# Patient Record
Sex: Male | Born: 1985 | Race: Black or African American | Hispanic: No | Marital: Single | Smoking: Never smoker
Health system: Southern US, Community
[De-identification: ages and names within clinical notes are randomized; demographics above are authoritative.]

## PROBLEM LIST (undated history)

## (undated) DIAGNOSIS — E119 Type 2 diabetes mellitus without complications: Secondary | ICD-10-CM

## (undated) DIAGNOSIS — I10 Essential (primary) hypertension: Secondary | ICD-10-CM

---
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
## 2004-03-02 ENCOUNTER — Ambulatory Visit: Payer: Self-pay | Admitting: Nurse Practitioner

## 2004-08-02 ENCOUNTER — Ambulatory Visit: Payer: Self-pay | Admitting: Nurse Practitioner

## 2004-11-21 ENCOUNTER — Ambulatory Visit: Payer: Self-pay | Admitting: Nurse Practitioner

## 2004-12-26 ENCOUNTER — Ambulatory Visit: Payer: Self-pay | Admitting: Nurse Practitioner

## 2004-12-26 ENCOUNTER — Ambulatory Visit (HOSPITAL_COMMUNITY): Admission: RE | Admit: 2004-12-26 | Discharge: 2004-12-26 | Payer: Self-pay | Admitting: Nurse Practitioner

## 2005-07-04 ENCOUNTER — Ambulatory Visit: Payer: Self-pay | Admitting: Nurse Practitioner

## 2005-07-05 ENCOUNTER — Ambulatory Visit: Payer: Self-pay | Admitting: *Deleted

## 2006-06-07 ENCOUNTER — Ambulatory Visit: Payer: Self-pay | Admitting: Nurse Practitioner

## 2006-07-04 ENCOUNTER — Ambulatory Visit: Payer: Self-pay | Admitting: Nurse Practitioner

## 2006-07-11 ENCOUNTER — Ambulatory Visit: Payer: Self-pay | Admitting: Nurse Practitioner

## 2007-01-01 ENCOUNTER — Encounter (INDEPENDENT_AMBULATORY_CARE_PROVIDER_SITE_OTHER): Payer: Self-pay | Admitting: *Deleted

## 2007-01-29 ENCOUNTER — Ambulatory Visit: Payer: Self-pay | Admitting: Family Medicine

## 2007-10-27 ENCOUNTER — Ambulatory Visit: Payer: Self-pay | Admitting: Family Medicine

## 2007-10-27 ENCOUNTER — Encounter (INDEPENDENT_AMBULATORY_CARE_PROVIDER_SITE_OTHER): Payer: Self-pay | Admitting: Family Medicine

## 2007-10-27 LAB — CONVERTED CEMR LAB
ALT: 59 units/L — ABNORMAL HIGH (ref 0–53)
Albumin: 4.2 g/dL (ref 3.5–5.2)
Alkaline Phosphatase: 51 units/L (ref 39–117)
Calcium: 8.9 mg/dL (ref 8.4–10.5)
Chloride: 98 meq/L (ref 96–112)
Glucose, Bld: 127 mg/dL — ABNORMAL HIGH (ref 70–99)
HDL: 32 mg/dL — ABNORMAL LOW (ref >39)
Microalb, Ur: 0.95 mg/dL (ref 0.00–1.89)
Sodium: 139 meq/L (ref 135–145)
Total Bilirubin: 0.3 mg/dL (ref 0.3–1.2)
Total CHOL/HDL Ratio: 4.2
Triglycerides: 340 mg/dL — ABNORMAL HIGH (ref <150)

## 2007-12-30 ENCOUNTER — Encounter (INDEPENDENT_AMBULATORY_CARE_PROVIDER_SITE_OTHER): Payer: Self-pay | Admitting: Family Medicine

## 2007-12-30 ENCOUNTER — Ambulatory Visit: Payer: Self-pay | Admitting: Internal Medicine

## 2007-12-30 LAB — CONVERTED CEMR LAB
Cholesterol: 168 mg/dL (ref 0–200)
HDL: 44 mg/dL (ref >39)

## 2008-01-16 ENCOUNTER — Ambulatory Visit: Payer: Self-pay | Admitting: Internal Medicine

## 2008-06-08 ENCOUNTER — Encounter (INDEPENDENT_AMBULATORY_CARE_PROVIDER_SITE_OTHER): Payer: Self-pay | Admitting: Internal Medicine

## 2008-06-08 ENCOUNTER — Ambulatory Visit: Payer: Self-pay | Admitting: Family Medicine

## 2008-06-30 ENCOUNTER — Ambulatory Visit: Payer: Self-pay | Admitting: Internal Medicine

## 2008-06-30 ENCOUNTER — Encounter (INDEPENDENT_AMBULATORY_CARE_PROVIDER_SITE_OTHER): Payer: Self-pay | Admitting: Internal Medicine

## 2008-06-30 LAB — CONVERTED CEMR LAB
ALT: 35 units/L (ref 0–53)
AST: 21 units/L (ref 0–37)
Albumin: 4.7 g/dL (ref 3.5–5.2)
Alkaline Phosphatase: 38 units/L — ABNORMAL LOW (ref 39–117)
Calcium: 9.7 mg/dL (ref 8.4–10.5)
Cholesterol: 172 mg/dL (ref 0–200)
Creatinine, Ser: 0.96 mg/dL (ref 0.40–1.50)
Glucose, Bld: 97 mg/dL (ref 70–99)
Potassium: 4.3 meq/L (ref 3.5–5.3)
Sodium: 137 meq/L (ref 135–145)
Total Bilirubin: 0.7 mg/dL (ref 0.3–1.2)
Total Protein: 7.7 g/dL (ref 6.0–8.3)

## 2009-07-04 ENCOUNTER — Ambulatory Visit: Payer: Self-pay | Admitting: Family Medicine

## 2012-01-25 ENCOUNTER — Encounter (HOSPITAL_COMMUNITY): Payer: Self-pay | Admitting: *Deleted

## 2012-01-25 ENCOUNTER — Encounter (HOSPITAL_COMMUNITY): Payer: Self-pay | Admitting: Nurse Practitioner

## 2012-01-25 ENCOUNTER — Emergency Department (INDEPENDENT_AMBULATORY_CARE_PROVIDER_SITE_OTHER)
Admission: EM | Admit: 2012-01-25 | Discharge: 2012-01-25 | Disposition: A | Discharge status: Nursing Home (Includes ICF) | Payer: Self-pay | Source: Home / Self Care | Attending: Family Medicine | Admitting: Family Medicine

## 2012-01-25 ENCOUNTER — Emergency Department (HOSPITAL_COMMUNITY)
Admission: EM | Admit: 2012-01-25 | Discharge: 2012-01-25 | Disposition: A | Discharge status: Home / Self Care | Payer: Self-pay | Attending: Emergency Medicine | Admitting: Emergency Medicine

## 2012-01-25 DIAGNOSIS — E119 Type 2 diabetes mellitus without complications: Secondary | ICD-10-CM | POA: Insufficient documentation

## 2012-01-25 DIAGNOSIS — L02211 Cutaneous abscess of abdominal wall: Secondary | ICD-10-CM

## 2012-01-25 DIAGNOSIS — I10 Essential (primary) hypertension: Secondary | ICD-10-CM | POA: Insufficient documentation

## 2012-01-25 DIAGNOSIS — Z760 Encounter for issue of repeat prescription: Secondary | ICD-10-CM

## 2012-01-25 DIAGNOSIS — L03319 Cellulitis of trunk, unspecified: Secondary | ICD-10-CM

## 2012-01-25 DIAGNOSIS — L02219 Cutaneous abscess of trunk, unspecified: Secondary | ICD-10-CM | POA: Insufficient documentation

## 2012-01-25 DIAGNOSIS — IMO0002 Reserved for concepts with insufficient information to code with codable children: Secondary | ICD-10-CM

## 2012-01-25 HISTORY — DX: Essential (primary) hypertension: I10

## 2012-01-25 HISTORY — DX: Type 2 diabetes mellitus without complications: E11.9

## 2012-01-25 LAB — POCT I-STAT, CHEM 8
Calcium, Ion: 1.1 mmol/L — ABNORMAL LOW (ref 1.12–1.23)
Chloride: 98 mEq/L (ref 96–112)
Glucose, Bld: 353 mg/dL — ABNORMAL HIGH (ref 70–99)
HCT: 51 % (ref 39.0–52.0)
Hemoglobin: 17.3 g/dL — ABNORMAL HIGH (ref 13.0–17.0)
Potassium: 3.8 mEq/L (ref 3.5–5.1)
Sodium: 136 mEq/L (ref 135–145)

## 2012-01-25 LAB — CBC WITH DIFFERENTIAL/PLATELET
Basophils Absolute: 0 10*3/uL (ref 0.0–0.1)
Hemoglobin: 16.6 g/dL (ref 13.0–17.0)
Lymphocytes Relative: 20 % (ref 12–46)
Monocytes Absolute: 0.9 10*3/uL (ref 0.1–1.0)
Monocytes Relative: 9 % (ref 3–12)
Neutro Abs: 7.5 10*3/uL (ref 1.7–7.7)
Platelets: 188 10*3/uL (ref 150–400)
RDW: 12.6 % (ref 11.5–15.5)
WBC: 10.6 10*3/uL — ABNORMAL HIGH (ref 4.0–10.5)

## 2012-01-25 MED ORDER — CLINDAMYCIN HCL 150 MG PO CAPS: 150.0000 mg | ORAL_CAPSULE | Freq: Four times a day (QID) | ORAL | Status: DC

## 2012-01-25 MED ORDER — HYDROCODONE-ACETAMINOPHEN 5-325 MG PO TABS: 1.0000 | ORAL_TABLET | ORAL | Status: DC | PRN

## 2012-01-25 MED ORDER — HYDROCHLOROTHIAZIDE 25 MG PO TABS: 25.0000 mg | ORAL_TABLET | Freq: Every day | ORAL | Status: DC

## 2012-01-25 MED ORDER — MORPHINE SULFATE 4 MG/ML IJ SOLN
4.0000 mg | Freq: Once | INTRAMUSCULAR | Status: AC
  Administered 2012-01-25: 4 mg via INTRAVENOUS
  Filled 2012-01-25: qty 1

## 2012-01-25 MED ORDER — METFORMIN HCL 850 MG PO TABS: 850.0000 mg | ORAL_TABLET | Freq: Two times a day (BID) | ORAL | Status: DC

## 2012-01-25 MED ORDER — LIDOCAINE-EPINEPHRINE 2 %-1:100000 IJ SOLN
20.0000 mL | Freq: Once | INTRAMUSCULAR | Status: AC
  Administered 2012-01-25: 20 mL via INTRADERMAL
  Filled 2012-01-25: qty 20

## 2012-01-25 MED ORDER — CLINDAMYCIN PHOSPHATE 600 MG/50ML IV SOLN
600.0000 mg | Freq: Once | INTRAVENOUS | Status: AC
  Administered 2012-01-25: 600 mg via INTRAVENOUS
  Filled 2012-01-25: qty 50

## 2012-01-25 NOTE — ED Provider Notes (Signed)
History     CSN: [id number]  Arrival date & time 01/25/12  1026   First MD Initiated Contact with Patient 01/25/12 1053      Chief Complaint  Patient presents with  . Recurrent Skin Infections    (Consider location/radiation/quality/duration/timing/severity/associated sxs/prior treatment) HPI  26 year old male with hx of diabetes presents for evaluation of skin infection.  Pt reports 1 week ago he noticed a bump near his waist line directly above his pubis.  Onset gradual, persistent, radiates to abdomen The bump has increased in size, with associated tenderness and surrounding redness.  Redness has increased with increasing pain.  Has tried neosporin and peroxide without relief.  Denies fever, headache,  n/v/d, dysuria, or testicular pain.  No prior hx of skin infection.  Denies tick exposure.  Has ran out of his metformin for 1 month. Does not have a PCP at this time. [facility]   Past Medical History  Diagnosis Date  . Diabetes mellitus without complication   . Hypertension     History reviewed. No pertinent past surgical history.  History reviewed. No pertinent family history.  History  Substance Use Topics  . Smoking status: Never Smoker   . Smokeless tobacco: Not on file  . Alcohol Use: No      Review of Systems  Constitutional: Negative for fever.  HENT: Negative for neck pain.   Respiratory: Negative for shortness of breath.   Cardiovascular: Negative for chest pain.  Skin: Positive for rash.  Neurological: Negative for numbness.  All other systems reviewed and are negative.    Allergies  Amoxicillin  Home Medications   Current Outpatient Rx  Name Route Sig Dispense Refill  . OMEGA-3 FATTY ACIDS 1000 MG PO CAPS Oral Take 1 g by mouth daily.    Marland Kitchen HYDROCHLOROTHIAZIDE 25 MG PO TABS Oral Take 25 mg by mouth daily.    Marland Kitchen METFORMIN HCL 850 MG PO TABS Oral Take 850 mg by mouth 2 (two) times daily with a meal.       BP 157/100  Pulse 95  Temp 99.1 F (37.3 C)  Resp 18  SpO2 99%  Physical Exam  Nursing note and vitals reviewed. Constitutional: He is oriented to person, place, and time. He appears well-developed and well-nourished. No distress.  HENT:  Head: Atraumatic.  Eyes: Conjunctivae normal are normal.  Abdominal: There is tenderness (cellulitic skin changes to lower panus (erythema, warmth) without induration or fluctuance.  No hernia).  Genitourinary: Penis normal.       Small area of induration and fluctuance noted above mon pubis.    Musculoskeletal: Normal range of motion. He exhibits no edema.  Neurological: He is alert and oriented to person, place, and time.  Skin: Skin is warm.    ED Course  Procedures (including critical care time)  Results for orders placed during the hospital encounter of 01/25/12  CBC WITH DIFFERENTIAL      Component Value Range   WBC 10.6 (*) 4.0 - 10.5 K/uL   RBC 5.80  4.22 - 5.81 MIL/uL   Hemoglobin 16.6  13.0 - 17.0 g/dL   HCT 40.9  81.1 - 91.4 %   MCV 80.0  78.0 - 100.0 fL   MCH 28.6  26.0 - 34.0 pg   MCHC 35.8  30.0 - 36.0 g/dL   RDW 78.2  95.6 - 21.3 %   Platelets 188  150 - 400 K/uL   Neutrophils Relative  70  43 - 77 %   Neutro Abs 7.5  1.7 - 7.7 K/uL   Lymphocytes Relative 20  12 - 46 %   Lymphs Abs 2.1  0.7 - 4.0 K/uL   Monocytes Relative 9  3 - 12 %   Monocytes Absolute 0.9  0.1 - 1.0 K/uL   Eosinophils Relative 1  0 - 5 %   Eosinophils Absolute 0.1  0.0 - 0.7 K/uL   Basophils Relative 0  0 - 1 %   Basophils Absolute 0.0  0.0 - 0.1 K/uL  POCT I-STAT, CHEM 8      Component Value Range   Sodium 136  135 - 145 mEq/L   Potassium 3.8  3.5 - 5.1 mEq/L   Chloride 98  96 - 112 mEq/L   BUN 8  6 - 23 mg/dL   Creatinine, Ser 1.61  0.50 - 1.35 mg/dL   Glucose, Bld 096 (*) 70 - 99 mg/dL   Calcium, Ion 0.45 (*) 1.12 - 1.23 mmol/L   TCO2 26  0 - 100 mmol/L   Hemoglobin 17.3 (*) 13.0 - 17.0 g/dL   HCT 40.9  81.1 - 91.4 %   No results  found.   INCISION AND DRAINAGE Performed by: Fayrene Helper Consent: Verbal consent obtained. Risks and benefits: risks, benefits and alternatives were discussed Type: abscess  Body area: anterior belt-line abd  Anesthesia: local infiltration  Local anesthetic: lidocaine 2% w epinephrine  Anesthetic total: 4 ml  Complexity: complex Blunt dissection to break up loculations  Drainage: purulent  Drainage amount: moderate  Packing material: none Patient tolerance: Patient tolerated the procedure well with no immediate complications.   1. Abscess cellulitis of abdominal wall  MDM  Pt has abscess cellulitis to lower abdominal wall. Has hx of diabetes.  Therefore, will perform I&D, and will treat cellulitis with IV clindamycine.  Pt otherwise afebrile, nontoxic, and in NAD.  Pain medication given.    1:02 PM I&D with moderate pustular discharge.  Due to location of wound, it was difficult to place packing.  Therefore, no packing placed.  Cellulitis margin were marked.  Pain medication given.  IV clindamycin given.     2:58 PM Pt felt better, finished abx.  Pt request for medication refill (metformin and HCTZ).  Will refill med, dc with clindamycin, pain medication.  Recommend return in 2 days for wound recheck.  Patient voiced understanding and agrees with plan  BP 133/73  Pulse 96  Temp 100.1 F (37.8 C) (Oral)  Resp 18  SpO2 100%  Nursing notes reviewed and considered in documentation  Previous records reviewed and considered  All labs/vitals reviewed and considered     Fayrene Helper, PA-C 01/25/12 1500

## 2012-01-25 NOTE — ED Notes (Signed)
Pt  Has  Redness  Tenderness  And  Swelling  To  The  abd

## 2012-01-25 NOTE — ED Provider Notes (Signed)
History     CSN: [id number]  Arrival date & time 01/25/12  7829   First MD Initiated Contact with Patient 01/25/12 [phone]      Chief Complaint  Patient presents with  . Abdominal Pain    (Consider location/radiation/quality/duration/timing/severity/associated sxs/prior treatment) Patient is a 26 y.o. male presenting with abdominal pain. The history is provided by the patient.  Abdominal Pain The primary symptoms of the illness include abdominal pain. The primary symptoms of the illness do not include fever, nausea, vomiting or diarrhea. The current episode started more than 2 days ago (1 week h/o pain , getting worse). The onset of the illness was gradual. The problem has been gradually worsening.  Risk factors for an acute abdominal problem include immunodeficiency. Additional symptoms associated with the illness include chills. Significant associated medical issues include diabetes.    Past Medical History  Diagnosis Date  . Diabetes mellitus without complication     History reviewed. No pertinent past surgical history.  No family history on file.  History  Substance Use Topics  . Smoking status: Never Smoker   . Smokeless tobacco: Not on file  . Alcohol Use: No      Review of Systems  Constitutional: Positive for chills. Negative for fever.  Gastrointestinal: Positive for abdominal pain. Negative for nausea, vomiting and diarrhea.  Skin: Positive for rash.    Allergies  Amoxapine and related and Amoxicillin  Home Medications   Current Outpatient Rx  Name Route Sig Dispense Refill  . METFORMIN HCL 850 MG PO TABS Oral Take 850 mg by mouth 2 (two) times daily with a meal.      BP 145/99  Pulse 86  Temp 99.9 F (37.7 C) (Oral)  Resp 20  SpO2 100%  Physical Exam  Nursing note and vitals reviewed. Constitutional: He is oriented to person, place, and time. He appears well-developed and well-nourished. He appears distressed.  Abdominal: He exhibits no  distension and no mass. There is tenderness. There is no rebound and no guarding.  Neurological: He is alert and oriented to person, place, and time.  Skin: Skin is warm and dry.       ED Course  Procedures (including critical care time)  Labs Reviewed - No data to display No results found.   1. Abdominal wall abscess       MDM         Linna Hoff, MD 01/25/12 1001

## 2012-01-25 NOTE — ED Notes (Signed)
Pt with reddened swollen tender area to abd, sent for further eval of cellulitis from [facility]

## 2012-01-25 NOTE — ED Notes (Signed)
Pt  Reports      r  Lower       Pain  With  Swelling   And    Tenderness  On  Palpation        He  Reports  The  Pain  For  About  1  Week  He  denys  Any injury     He  denys  Any  Vomiting     He  Is  A  Diabetic  Who  Is  Non  -  Compliant

## 2012-01-27 ENCOUNTER — Emergency Department (HOSPITAL_COMMUNITY)
Admission: EM | Admit: 2012-01-27 | Discharge: 2012-01-27 | Disposition: A | Discharge status: Home Health | Payer: Self-pay | Attending: Emergency Medicine | Admitting: Emergency Medicine

## 2012-01-27 ENCOUNTER — Encounter (HOSPITAL_COMMUNITY): Payer: Self-pay | Admitting: *Deleted

## 2012-01-27 DIAGNOSIS — Z09 Encounter for follow-up examination after completed treatment for conditions other than malignant neoplasm: Secondary | ICD-10-CM | POA: Insufficient documentation

## 2012-01-27 DIAGNOSIS — Z5189 Encounter for other specified aftercare: Secondary | ICD-10-CM

## 2012-01-27 DIAGNOSIS — I10 Essential (primary) hypertension: Secondary | ICD-10-CM | POA: Insufficient documentation

## 2012-01-27 DIAGNOSIS — E119 Type 2 diabetes mellitus without complications: Secondary | ICD-10-CM | POA: Insufficient documentation

## 2012-01-27 DIAGNOSIS — L02219 Cutaneous abscess of trunk, unspecified: Secondary | ICD-10-CM | POA: Insufficient documentation

## 2012-01-27 DIAGNOSIS — Z881 Allergy status to other antibiotic agents status: Secondary | ICD-10-CM | POA: Insufficient documentation

## 2012-01-27 NOTE — ED Notes (Signed)
Pt presents to department for evaluation of abscess follow up. Pt states he had I&D performed to R lower abdominal abscess x2 days ago. Pt states swelling and pain has decreased. No drainage/redness noted at the time. Denies pain. He is alert and oriented x4. No signs of distress noted at the time.

## 2012-01-27 NOTE — ED Provider Notes (Signed)
Medical screening examination/treatment/procedure(s) were performed by non-physician practitioner and as supervising physician I was immediately available for consultation/collaboration.  Toy Baker, MD 01/27/12 [phone]

## 2012-01-27 NOTE — ED Notes (Signed)
Pt is here to have right lower abdominal abscess site rechecked after being drained 2 days ago.  Denies worsening symptoms

## 2012-01-27 NOTE — ED Provider Notes (Signed)
History   .This chart was scribed for Derwood Kaplan, MD by Charolett Bumpers . The patient was seen in room TR08C/TR08C. Patient's care was started at 12:15.  CSN: [id number] Arrival date & time 01/27/12  1156  None    Chief Complaint  Patient presents with  . Follow-up   HPI Comments: William Sanders is a 26 y.o. male who presents to the Emergency Department for a re-check after having an abscess located in her perineum are drained 2 days ago. Pt states the abscess was not packed. He reports an associated rash that has since improved and is within the skin marking from 2 days ago. No current pain. He states his symptoms have improved. He denies any n/v, fevers, chills. Currently on antibiotics. He has a h/o diabetes, He denies any other complaints at this time.   Patient is a 26 y.o. male presenting with wound check. The history is provided by the patient. No language interpreter was used.  Wound Check  He was treated in the ED 2 to 3 days ago. Previous treatment in the ED includes I&D of abscess. Treatments since wound repair include oral antibiotics. There has been no drainage from the wound. The redness has improved. The swelling has improved. The pain has improved.    Past Medical History  Diagnosis Date  . Diabetes mellitus without complication   . Hypertension     History reviewed. No pertinent past surgical history.  No family history on file.  History  Substance Use Topics  . Smoking status: Never Smoker   . Smokeless tobacco: Not on file  . Alcohol Use: No      Review of Systems  Constitutional: Negative for fever and chills.  Respiratory: Negative for cough and shortness of breath.   Gastrointestinal: Negative for nausea and vomiting.  Skin: Positive for wound.  Neurological: Negative for weakness and headaches.  All other systems reviewed and are negative.    Allergies  Amoxicillin  Home Medications   Current Outpatient Rx  Name Route Sig Dispense  Refill  . CLINDAMYCIN HCL 150 MG PO CAPS Oral Take 1 capsule (150 mg total) by mouth every 6 (six) hours. 28 capsule 0  . OMEGA-3 FATTY ACIDS 1000 MG PO CAPS Oral Take 1 g by mouth daily.    Marland Kitchen HYDROCHLOROTHIAZIDE 25 MG PO TABS Oral Take 1 tablet (25 mg total) by mouth daily. 30 tablet 3  . HYDROCODONE-ACETAMINOPHEN 5-325 MG PO TABS Oral Take 1 tablet by mouth every 4 (four) hours as needed for pain. 10 tablet 0  . METFORMIN HCL 850 MG PO TABS Oral Take 1 tablet (850 mg total) by mouth 2 (two) times daily with a meal. 60 tablet 3    BP 129/80  Pulse 91  Temp 98.4 F (36.9 C) (Oral)  Resp 18  SpO2 97%  Physical Exam  Nursing note and vitals reviewed. Constitutional: He is oriented to person, place, and time. He appears well-developed and well-nourished. No distress.  HENT:  Head: Normocephalic and atraumatic.  Eyes: EOM are normal.  Neck: Neck supple. No tracheal deviation present.  Cardiovascular: Normal rate.   Pulmonary/Chest: Effort normal. No respiratory distress.  Musculoskeletal: Normal range of motion.  Neurological: He is alert and oriented to person, place, and time.  Skin: Skin is warm and dry. Rash noted.       In perineum a puncture wound with induration surrounding. No fluctuance or active drainage. Rash is now better and within the skin markings made previously.  Psychiatric: He has a normal mood and affect. His behavior is normal.    ED Course  Procedures (including critical care time)  DIAGNOSTIC STUDIES: Oxygen Saturation is 97% on room air, normal by my interpretation.    COORDINATION OF CARE:  12:18-Discussed planned course of treatment with the patient including continuing with antibiotics and keeping area sterile, who is agreeable at this time.     Labs Reviewed - No data to display No results found.   No diagnosis found.    MDM  Medical screening examination/treatment/procedure(s) were performed by me as the supervising physician. Scribe  service was utilized for documentation only.  Pt comes in for wound check. The rash is clearly way within the demarcated boundary from 2 days ago, the i&d side looks clean, no discharge, no abd pain, no crepitus. Will be discharged with continued expectant care.    Derwood Kaplan, MD 01/27/12 1244

## 2012-01-27 NOTE — ED Notes (Signed)
Pt discharged to home with family. NAD. Pt voiced understanding of discharge instructions.

## 2013-01-28 ENCOUNTER — Emergency Department (HOSPITAL_COMMUNITY)
Admission: EM | Admit: 2013-01-28 | Discharge: 2013-01-28 | Disposition: A | Discharge status: Home / Self Care | Payer: BC Managed Care – PPO | Attending: Emergency Medicine | Admitting: Emergency Medicine

## 2013-01-28 ENCOUNTER — Encounter (HOSPITAL_COMMUNITY): Payer: Self-pay | Admitting: Emergency Medicine

## 2013-01-28 DIAGNOSIS — Z79899 Other long term (current) drug therapy: Secondary | ICD-10-CM | POA: Insufficient documentation

## 2013-01-28 DIAGNOSIS — K0889 Other specified disorders of teeth and supporting structures: Secondary | ICD-10-CM

## 2013-01-28 DIAGNOSIS — E119 Type 2 diabetes mellitus without complications: Secondary | ICD-10-CM | POA: Insufficient documentation

## 2013-01-28 DIAGNOSIS — K044 Acute apical periodontitis of pulpal origin: Secondary | ICD-10-CM | POA: Insufficient documentation

## 2013-01-28 DIAGNOSIS — I10 Essential (primary) hypertension: Secondary | ICD-10-CM | POA: Insufficient documentation

## 2013-01-28 DIAGNOSIS — K047 Periapical abscess without sinus: Secondary | ICD-10-CM

## 2013-01-28 MED ORDER — HYDROCODONE-ACETAMINOPHEN 5-325 MG PO TABS
1.0000 | ORAL_TABLET | ORAL | Status: DC | PRN
Start: 2013-01-28 — End: 2013-10-26

## 2013-01-28 MED ORDER — CLINDAMYCIN HCL 300 MG PO CAPS: 300.0000 mg | ORAL_CAPSULE | Freq: Four times a day (QID) | ORAL | Status: DC

## 2013-01-28 NOTE — ED Provider Notes (Signed)
CSN: [id number]     Arrival date & time 01/28/13  0747 History   First MD Initiated Contact with Patient 01/28/13 0750     Chief Complaint  Patient presents with  . Dental Pain   (Consider location/radiation/quality/duration/timing/severity/associated sxs/prior Treatment) HPI Comments: 27 y/o male presents to the ED complaining of left lower dental pain intermittently x 2 weeks. Pain described as dull, sharp with chewing rated 4-5/10. Denies facial swelling, fever, chills, difficulty swallowing. Tried taking ibuprofen with minimal relief. States his wisdom teeth have been bothering him recently and would like a referral to a dentist to have them removed.   Patient is a 27 y.o. male presenting with tooth pain. The history is provided by the patient.  Dental Pain   Past Medical History  Diagnosis Date  . Diabetes mellitus without complication   . Hypertension    History reviewed. No pertinent past surgical history. No family history on file. History  Substance Use Topics  . Smoking status: Never Smoker   . Smokeless tobacco: Not on file  . Alcohol Use: No    Review of Systems  HENT: Positive for dental problem.   All other systems reviewed and are negative.    Allergies  Amoxicillin  Home Medications   Current Outpatient Rx  Name  Route  Sig  Dispense  Refill  . clindamycin (CLEOCIN) 150 MG capsule   Oral   Take 150 mg by mouth every 6 (six) hours. FOR 7 DAYS; Started on 01/25/12.         . clindamycin (CLEOCIN) 300 MG capsule   Oral   Take 1 capsule (300 mg total) by mouth 4 (four) times daily. X 7 days   28 capsule   0   . hydrochlorothiazide (HYDRODIURIL) 25 MG tablet   Oral   Take 1 tablet (25 mg total) by mouth daily.   30 tablet   3   . HYDROcodone-acetaminophen (NORCO/VICODIN) 5-325 MG per tablet   Oral   Take 1 tablet by mouth every 4 (four) hours as needed for pain.   10 tablet   0   . HYDROcodone-acetaminophen (NORCO/VICODIN) 5-325 MG per  tablet   Oral   Take 1-2 tablets by mouth every 4 (four) hours as needed for pain.   8 tablet   0   . metFORMIN (GLUCOPHAGE) 850 MG tablet   Oral   Take 1 tablet (850 mg total) by mouth 2 (two) times daily with a meal.   60 tablet   3    BP 153/93  Pulse 91  Temp(Src) 98.4 F (36.9 C) (Oral)  Resp 18  Ht 5\' 10"  (1.778 m)  Wt 288 lb (130.636 kg)  BMI 41.32 kg/m2  SpO2 98% Physical Exam  Constitutional: He is oriented to person, place, and time. He appears well-developed and well-nourished. No distress.  HENT:  Head: Normocephalic and atraumatic.  Mouth/Throat: Uvula is midline, oropharynx is clear and moist and mucous membranes are normal.    Left lower third molar TTP with surrounding erythema and edema. No abscess.  Eyes: Conjunctivae are normal.  Neck: Normal range of motion. Neck supple.  Cardiovascular: Normal rate, regular rhythm and normal heart sounds.   Pulmonary/Chest: Effort normal and breath sounds normal.  Musculoskeletal: Normal range of motion. He exhibits no edema.  Lymphadenopathy:       Head (left side): Submandibular adenopathy present.  Neurological: He is alert and oriented to person, place, and time.  Skin: Skin is warm and dry. He  is not diaphoretic.  Psychiatric: He has a normal mood and affect. His behavior is normal.    ED Course  Procedures (including critical care time) Labs Review Labs Reviewed - No data to display Imaging Review No results found.  EKG Interpretation   None       MDM   1. Dental infection   2. Pain, dental     Dental pain associated with dental infection. No evidence of dental abscess. Patient is afebrile, non toxic appearing and swallowing secretions well. I gave patient referral to dentist and stressed the importance of dental follow up for ultimate management of dental pain. I will also give amoxicillin and pain control. Patient voices understanding and is agreeable to plan.   Trevor Mace, PA-C 01/28/13  [phone]

## 2013-01-28 NOTE — ED Notes (Signed)
Pt c/o left sided dental pain X 2 weeks. sts he doesn't have a dentist and would also like a referral to one. Pt in nad, skin warm and dry, resp e/u.

## 2013-01-28 NOTE — ED Notes (Signed)
Pharmacy tech at bedside 

## 2013-01-31 NOTE — ED Provider Notes (Signed)
Medical screening examination/treatment/procedure(s) were performed by non-physician practitioner and as supervising physician I was immediately available for consultation/collaboration.  Donnetta Hutching, MD 01/31/13 [phone]

## 2013-10-26 ENCOUNTER — Emergency Department (HOSPITAL_COMMUNITY): Payer: BC Managed Care – PPO

## 2013-10-26 ENCOUNTER — Inpatient Hospital Stay (HOSPITAL_COMMUNITY)
Admission: EM | Admit: 2013-10-26 | Discharge: 2013-10-29 | DRG: 872 | Disposition: A | Discharge status: Home / Self Care | Payer: BC Managed Care – PPO | Attending: Internal Medicine | Admitting: Internal Medicine

## 2013-10-26 ENCOUNTER — Encounter (HOSPITAL_COMMUNITY): Payer: Self-pay | Admitting: Emergency Medicine

## 2013-10-26 DIAGNOSIS — Z91199 Patient's noncompliance with other medical treatment and regimen due to unspecified reason: Secondary | ICD-10-CM

## 2013-10-26 DIAGNOSIS — E118 Type 2 diabetes mellitus with unspecified complications: Secondary | ICD-10-CM

## 2013-10-26 DIAGNOSIS — E11628 Type 2 diabetes mellitus with other skin complications: Secondary | ICD-10-CM

## 2013-10-26 DIAGNOSIS — Z598 Other problems related to housing and economic circumstances: Secondary | ICD-10-CM

## 2013-10-26 DIAGNOSIS — R739 Hyperglycemia, unspecified: Secondary | ICD-10-CM

## 2013-10-26 DIAGNOSIS — IMO0001 Reserved for inherently not codable concepts without codable children: Secondary | ICD-10-CM | POA: Diagnosis present

## 2013-10-26 DIAGNOSIS — L03319 Cellulitis of trunk, unspecified: Secondary | ICD-10-CM

## 2013-10-26 DIAGNOSIS — Z881 Allergy status to other antibiotic agents status: Secondary | ICD-10-CM

## 2013-10-26 DIAGNOSIS — E1165 Type 2 diabetes mellitus with hyperglycemia: Secondary | ICD-10-CM

## 2013-10-26 DIAGNOSIS — IMO0002 Reserved for concepts with insufficient information to code with codable children: Secondary | ICD-10-CM | POA: Diagnosis present

## 2013-10-26 DIAGNOSIS — L0390 Cellulitis, unspecified: Secondary | ICD-10-CM

## 2013-10-26 DIAGNOSIS — L02211 Cutaneous abscess of abdominal wall: Secondary | ICD-10-CM

## 2013-10-26 DIAGNOSIS — L02219 Cutaneous abscess of trunk, unspecified: Secondary | ICD-10-CM | POA: Diagnosis present

## 2013-10-26 DIAGNOSIS — Z5987 Material hardship due to limited financial resources, not elsewhere classified: Secondary | ICD-10-CM

## 2013-10-26 DIAGNOSIS — I10 Essential (primary) hypertension: Secondary | ICD-10-CM | POA: Diagnosis present

## 2013-10-26 DIAGNOSIS — Z8249 Family history of ischemic heart disease and other diseases of the circulatory system: Secondary | ICD-10-CM

## 2013-10-26 DIAGNOSIS — L03311 Cellulitis of abdominal wall: Secondary | ICD-10-CM | POA: Diagnosis present

## 2013-10-26 DIAGNOSIS — Z9119 Patient's noncompliance with other medical treatment and regimen: Secondary | ICD-10-CM

## 2013-10-26 DIAGNOSIS — A419 Sepsis, unspecified organism: Principal | ICD-10-CM | POA: Diagnosis present

## 2013-10-26 LAB — COMPREHENSIVE METABOLIC PANEL
ALBUMIN: 4 g/dL (ref 3.5–5.2)
ALT: 123 U/L — AB (ref 0–53)
ANION GAP: 19 — AB (ref 5–15)
AST: 46 U/L — ABNORMAL HIGH (ref 0–37)
Alkaline Phosphatase: 68 U/L (ref 39–117)
BILIRUBIN TOTAL: 0.9 mg/dL (ref 0.3–1.2)
BUN: 7 mg/dL (ref 6–23)
CALCIUM: 9.1 mg/dL (ref 8.4–10.5)
CO2: 21 mEq/L (ref 19–32)
Chloride: 94 mEq/L — ABNORMAL LOW (ref 96–112)
Creatinine, Ser: 0.74 mg/dL (ref 0.50–1.35)
GFR calc non Af Amer: >90 mL/min (ref >90)
Glucose, Bld: 403 mg/dL — ABNORMAL HIGH (ref 70–99)
Potassium: 3.8 mEq/L (ref 3.7–5.3)
Sodium: 134 mEq/L — ABNORMAL LOW (ref 137–147)
TOTAL PROTEIN: 8.1 g/dL (ref 6.0–8.3)

## 2013-10-26 LAB — CBC WITH DIFFERENTIAL/PLATELET
Basophils Absolute: 0 10*3/uL (ref 0.0–0.1)
Basophils Relative: 0 % (ref 0–1)
EOS ABS: 0 10*3/uL (ref 0.0–0.7)
EOS PCT: 0 % (ref 0–5)
HEMATOCRIT: 47.5 % (ref 39.0–52.0)
Hemoglobin: 16.9 g/dL (ref 13.0–17.0)
LYMPHS ABS: 2.7 10*3/uL (ref 0.7–4.0)
LYMPHS PCT: 30 % (ref 12–46)
MCH: 28.7 pg (ref 26.0–34.0)
MCHC: 35.6 g/dL (ref 30.0–36.0)
MCV: 80.6 fL (ref 78.0–100.0)
MONOS PCT: 11 % (ref 3–12)
Monocytes Absolute: 1 10*3/uL (ref 0.1–1.0)
NEUTROS ABS: 5.3 10*3/uL (ref 1.7–7.7)
NEUTROS PCT: 59 % (ref 43–77)
PLATELETS: 183 10*3/uL (ref 150–400)
RBC: 5.89 MIL/uL — AB (ref 4.22–5.81)
RDW: 12.9 % (ref 11.5–15.5)
WBC: 9 10*3/uL (ref 4.0–10.5)

## 2013-10-26 LAB — I-STAT CG4 LACTIC ACID, ED: LACTIC ACID, VENOUS: 2.14 mmol/L (ref 0.5–2.2)

## 2013-10-26 LAB — CBG MONITORING, ED: GLUCOSE-CAPILLARY: 255 mg/dL — AB (ref 70–99)

## 2013-10-26 MED ORDER — ACETAMINOPHEN 325 MG PO TABS
650.0000 mg | ORAL_TABLET | Freq: Four times a day (QID) | ORAL | Status: DC | PRN
  Administered 2013-10-26: 650 mg via ORAL
  Filled 2013-10-26: qty 2

## 2013-10-26 MED ORDER — INSULIN ASPART 100 UNIT/ML ~~LOC~~ SOLN
5.0000 [IU] | Freq: Once | SUBCUTANEOUS | Status: AC
  Administered 2013-10-26: 5 [IU] via INTRAVENOUS
  Filled 2013-10-26: qty 1

## 2013-10-26 MED ORDER — ACETAMINOPHEN 325 MG PO TABS
650.0000 mg | ORAL_TABLET | Freq: Once | ORAL | Status: DC
  Filled 2013-10-26: qty 2

## 2013-10-26 MED ORDER — CLINDAMYCIN PHOSPHATE 600 MG/50ML IV SOLN
600.0000 mg | Freq: Once | INTRAVENOUS | Status: AC
  Administered 2013-10-26: 600 mg via INTRAVENOUS
  Filled 2013-10-26: qty 50

## 2013-10-26 MED ORDER — IOHEXOL 300 MG/ML  SOLN
100.0000 mL | Freq: Once | INTRAMUSCULAR | Status: AC | PRN
  Administered 2013-10-26: 100 mL via INTRAVENOUS

## 2013-10-26 MED ORDER — SODIUM CHLORIDE 0.9 % IV BOLUS (SEPSIS): 1000.0000 mL | Freq: Once | INTRAVENOUS | Status: DC

## 2013-10-26 MED ORDER — SODIUM CHLORIDE 0.9 % IV SOLN: INTRAVENOUS | Status: AC

## 2013-10-26 NOTE — Discharge Instructions (Signed)
Metformin and X-ray Contrast Studies °For some X-ray exams, a contrast dye is used. Contrast dye is a type of medicine used to make the X-ray image clearer. The contrast dye is given to the patient through a vein (intravenously). If you need to have this type of X-ray exam and you take a medication called metformin, your caregiver may have you stop taking metformin before the exam.  °LACTIC ACIDOSIS °In rare cases, a serious medical condition called lactic acidosis can develop in people who take metformin and receive contrast dye. The following conditions can increase the risk of this complication:  °· Kidney failure. °· Liver problems. °· Certain types of heart problems such as: °¨ Heart failure. °¨ Heart attack. °¨ Heart infection. °¨ Heart valve problems. °· Alcohol abuse. °If left untreated, lactic acidosis can lead to coma.  °SYMPTOMS OF LACTIC ACIDOSIS °Symptoms of lactic acidosis can include: °· Rapid breathing (hyperventilation). °· Neurologic symptoms such as: °¨ Headaches. °¨ Confusion. °¨ Dizziness. °· Excessive sweating. °· Feeling sick to your stomach (nauseous) or throwing up (vomiting). °AFTER THE X-RAY EXAM °· Stay well-hydrated. Drink fluids as instructed by your caregiver. °· If you have a risk of developing lactic acidosis, blood tests may be done to make sure your kidney function is okay. °· Metformin is usually stopped for 48 hours after the X-ray exam. Ask your caregiver when you can start taking metformin again. °SEEK MEDICAL CARE IF:  °· You have shortness of breath or difficulty breathing. °· You develop a headache that does not go away. °· You have nausea or vomiting. °· You urinate more than normal. °· You develop a skin rash and have: °¨ Redness. °¨ Swelling. °¨ Itching. °Document Released: 03/21/2009 Document Revised: 06/25/2011 Document Reviewed: 03/21/2009 °[employer]® Patient Information ©2015 [employer]. This information is not intended to replace advice given to you by your health  care provider. Make sure you discuss any questions you have with your health care provider. ° °

## 2013-10-26 NOTE — Progress Notes (Signed)
Received pt report from Tiffany,RN-ED.

## 2013-10-26 NOTE — ED Notes (Signed)
Pt here with c/o lower abd swelling, redness, warmth, and burning sensation. Pt reports he noticed sx on Saturday and they progressively got worse. Pt has hx of cyst on abd, pt had cyst drained the last time he was here. Pt rates abd pain 0/10, states he only has pain when he moves.

## 2013-10-26 NOTE — ED Notes (Signed)
Dr Zavitz at bedside  

## 2013-10-26 NOTE — ED Provider Notes (Signed)
CSN: [id number]     Arrival date & time 10/26/13  2014 History   First MD Initiated Contact with Patient 10/26/13 2041     Chief Complaint  Patient presents with  . Abdominal Pain     (Consider location/radiation/quality/duration/timing/severity/associated sxs/prior Treatment) HPI Comments: 28 year old male with history of diabetes on oral medications, high blood pressure, abscess, nonsmoker, diabetes normally controlled presents with worsening erythema lower abdomen and cyst/abscess. Patient has had draining in the past and is not currently in antibiotics. Symptoms worsening the past 2 days and fever just noticed this evening. Patient feels overall well otherwise without vomiting or new other symptoms.  Patient is a 28 y.o. male presenting with abdominal pain. The history is provided by the patient.  Abdominal Pain Associated symptoms: no chest pain, no chills, no dysuria, no fever, no shortness of breath and no vomiting     Past Medical History  Diagnosis Date  . Diabetes mellitus without complication   . Hypertension    History reviewed. No pertinent past surgical history. No family history on file. History  Substance Use Topics  . Smoking status: Never Smoker   . Smokeless tobacco: Not on file  . Alcohol Use: No    Review of Systems  Constitutional: Positive for appetite change. Negative for fever and chills.  HENT: Negative for congestion.   Eyes: Negative for visual disturbance.  Respiratory: Negative for shortness of breath.   Cardiovascular: Negative for chest pain.  Gastrointestinal: Positive for abdominal pain. Negative for vomiting.  Genitourinary: Negative for dysuria and flank pain.  Musculoskeletal: Negative for back pain, neck pain and neck stiffness.  Skin: Positive for color change and rash.  Neurological: Negative for light-headedness and headaches.      Allergies  Amoxicillin  Home Medications   Prior to Admission medications   Medication Sig  Start Date End Date Taking? Authorizing Provider  hydrochlorothiazide (HYDRODIURIL) 25 MG tablet Take 25 mg by mouth daily.   Yes Historical Provider, MD  metFORMIN (GLUCOPHAGE) 850 MG tablet Take 850 mg by mouth 2 (two) times daily with a meal.   Yes Historical Provider, MD   BP 145/89  Pulse 115  Temp(Src) 101.6 F (38.7 C) (Oral)  Resp 16  Ht 5\' 9"  (1.753 m)  Wt 288 lb (130.636 kg)  BMI 42.51 kg/m2  SpO2 97% Physical Exam  Nursing note and vitals reviewed. Constitutional: He is oriented to person, place, and time. He appears well-developed and well-nourished.  HENT:  Head: Normocephalic and atraumatic.  Eyes: Conjunctivae are normal. Right eye exhibits no discharge. Left eye exhibits no discharge.  Neck: Normal range of motion. Neck supple. No tracheal deviation present.  Cardiovascular: Regular rhythm.  Tachycardia present.   Pulmonary/Chest: Effort normal and breath sounds normal.  Abdominal: Soft. He exhibits no distension. There is tenderness (superficial tenderness to cellulitic region lower central abdomen with mild induration left aspect, no crepitus). There is no guarding.  Musculoskeletal: He exhibits no edema.  Neurological: He is alert and oriented to person, place, and time.  Skin: Skin is warm. Rash noted.     Cellulitis lower abdomen with mild induration no crepitus, no involvement of testicles or groin.  Psychiatric: He has a normal mood and affect.    ED Course  Procedures (including critical care time) Labs Review Labs Reviewed  CBC WITH DIFFERENTIAL - Abnormal; Notable for the following:    RBC 5.89 (*)    All other components within normal limits  COMPREHENSIVE METABOLIC PANEL - Abnormal; Notable  for the following:    Sodium 134 (*)    Chloride 94 (*)    Glucose, Bld 403 (*)    AST 46 (*)    ALT 123 (*)    Anion gap 19 (*)    All other components within normal limits  CBG MONITORING, ED - Abnormal; Notable for the following:    Glucose-Capillary  255 (*)    All other components within normal limits  CULTURE, BLOOD (ROUTINE X 2)  CULTURE, BLOOD (ROUTINE X 2)  I-STAT CG4 LACTIC ACID, ED    Imaging Review Ct Abdomen Pelvis W Contrast  10/26/2013   CLINICAL DATA:  Abdominal wall cellulitis  EXAM: CT ABDOMEN AND PELVIS WITH CONTRAST  TECHNIQUE: Multidetector CT imaging of the abdomen and pelvis was performed using the standard protocol following bolus administration of intravenous contrast.  CONTRAST:  OMNIPAQUE IOHEXOL 300 MG/ML  SOLN  COMPARISON:  None.  FINDINGS: Lung bases are clear.  Liver is enlarged measuring 20.6 cm in length. There is diffuse fatty change in the liver. No focal liver lesions are appreciated. There is no appreciable biliary duct dilatation. Gallbladder wall is not thickened.  Spleen, pancreas, and adrenals appear normal. Kidneys bilaterally show no mass or hydronephrosis on either side. There is no renal or ureteral calculus on either side. There is a retroaortic left renal vein, an anatomic variant.  There is thickening of the subcutaneous tissue in the anterior mid to lower abdominal and pelvic wall regions. There is a fluid collection in the anterior left pelvic wall with surrounding inflammation measuring 4.9 x 3.2 cm. On the right in this same area, there is a smaller thick-walled fluid collection measuring 1.9 x 1.9 cm in the subcutaneous region. Both of these lesions are best seen on axial slice 74 series 201.  In the pelvis, the urinary bladder is midline with normal wall thickness. There is no pelvic mass or fluid collection. There are small inguinal lymph nodes which do not meet size criteria for pathologic significance. The appendix appears normal.  There is no bowel obstruction.  No free air or portal venous air.  There is no ascites or adenopathy in the abdomen or pelvis. There is no intraperitoneal or retroperitoneal abscess. There is no evidence of abdominal aortic aneurysm. There are no blastic or lytic bone  lesions.  IMPRESSION: Anterior lower abdominal and pelvic wall cellulitis. At the level of the mid to lower pelvis, there is a phlegmon in the anterior left pelvic wall measuring 4.9 x 3.2 cm. At this same level on the right, there is a 1.9 x 1.9 cm phlegmon in the subcutaneous region of the anterior pelvic wall as well.  Liver is enlarged with fatty change.  Appendix appears normal. No bowel obstruction. No intraperitoneal or retroperitoneal abscess. No renal or ureteral calculus. No hydronephrosis.   Electronically Signed   By: Bretta Bang M.D.   On: 10/26/2013 22:19     EKG Interpretation None      MDM   Final diagnoses:  Abdominal wall cellulitis  Abdominal wall abscess  DM Hyperglycemia  Clinically controlled diabetic with cellulitis and likely abscess. With significant synovitis on exam and lower abdominal region plan for blood work, clindamycin IV, Tylenol and imaging to delineate extension of abscess/cellulitis. Fluid bolus given. Insulin given. And  Patient with significant abdominal wall cellulitis with early abscess on CT scan. Discussed general surgery who recommends IV antibiotics by medicine and we'll monitor. No I&D at this time. Updated patient on  plan.  The patients results and plan were reviewed and discussed.   Any x-rays performed were personally reviewed by myself.   Differential diagnosis were considered with the presenting HPI.  Medications  acetaminophen (TYLENOL) tablet 650 mg (650 mg Oral Given 10/26/13 2038)  sodium chloride 0.9 % bolus 1,000 mL (1,000 mLs Intravenous Transfusing/Transfer 10/26/13 2221)  0.9 %  sodium chloride infusion (not administered)  clindamycin (CLEOCIN) IVPB 600 mg (0 mg Intravenous Stopped 10/26/13 2256)  iohexol (OMNIPAQUE) 300 MG/ML solution 100 mL (100 mLs Intravenous Contrast Given 10/26/13 2154)  insulin aspart (novoLOG) injection 5 Units (5 Units Intravenous Given 10/26/13 2229)   he is an in and  Abdominal wall cellulitis,  sepsis, abdominal wall phlegmon/abscess, hyperglycemia, diabetes  Filed Vitals:   10/26/13 2223 10/26/13 2230 10/26/13 2300 10/26/13 2330  BP:  128/72 142/81 138/76  Pulse:  107 105 103  Temp: 99.3 F (37.4 C)     TempSrc: Oral     Resp:  15 24 20   Height:      Weight:      SpO2:  98% 94% 97%    Admission/ observation were discussed with the admitting physician, patient and/or family and they are comfortable with the plan.     Enid Skeens, MD 10/27/13 0001

## 2013-10-26 NOTE — ED Notes (Signed)
I Stat Lactic Acid results shown to Dr. J. Zavitz 

## 2013-10-26 NOTE — ED Notes (Signed)
Pt states he has been having some lower abd swelling that is similar to when he has a cyst that needed to be drained in the past.  Pt has a large area of swelling to his lower abd that is red

## 2013-10-27 ENCOUNTER — Encounter (HOSPITAL_COMMUNITY)
Admission: EM | Disposition: A | Discharge status: Home / Self Care | Payer: Self-pay | Source: Home / Self Care | Attending: Internal Medicine

## 2013-10-27 ENCOUNTER — Encounter (HOSPITAL_COMMUNITY): Payer: BC Managed Care – PPO | Admitting: Certified Registered"

## 2013-10-27 ENCOUNTER — Inpatient Hospital Stay (HOSPITAL_COMMUNITY): Payer: BC Managed Care – PPO | Admitting: Certified Registered"

## 2013-10-27 ENCOUNTER — Encounter (HOSPITAL_COMMUNITY): Payer: Self-pay | Admitting: Internal Medicine

## 2013-10-27 DIAGNOSIS — L03319 Cellulitis of trunk, unspecified: Secondary | ICD-10-CM

## 2013-10-27 DIAGNOSIS — R7309 Other abnormal glucose: Secondary | ICD-10-CM

## 2013-10-27 DIAGNOSIS — IMO0002 Reserved for concepts with insufficient information to code with codable children: Secondary | ICD-10-CM | POA: Diagnosis present

## 2013-10-27 DIAGNOSIS — L02219 Cutaneous abscess of trunk, unspecified: Secondary | ICD-10-CM

## 2013-10-27 DIAGNOSIS — E1165 Type 2 diabetes mellitus with hyperglycemia: Secondary | ICD-10-CM | POA: Diagnosis present

## 2013-10-27 DIAGNOSIS — L0291 Cutaneous abscess, unspecified: Secondary | ICD-10-CM

## 2013-10-27 DIAGNOSIS — A419 Sepsis, unspecified organism: Secondary | ICD-10-CM

## 2013-10-27 DIAGNOSIS — L0390 Cellulitis, unspecified: Secondary | ICD-10-CM

## 2013-10-27 DIAGNOSIS — I10 Essential (primary) hypertension: Secondary | ICD-10-CM

## 2013-10-27 DIAGNOSIS — E118 Type 2 diabetes mellitus with unspecified complications: Secondary | ICD-10-CM

## 2013-10-27 DIAGNOSIS — E1169 Type 2 diabetes mellitus with other specified complication: Secondary | ICD-10-CM

## 2013-10-27 DIAGNOSIS — E119 Type 2 diabetes mellitus without complications: Secondary | ICD-10-CM

## 2013-10-27 DIAGNOSIS — L988 Other specified disorders of the skin and subcutaneous tissue: Secondary | ICD-10-CM

## 2013-10-27 HISTORY — PX: DEBRIDEMENT OF ABDOMINAL WALL ABSCESS: SHX6396

## 2013-10-27 LAB — CK TOTAL AND CKMB (NOT AT ARMC)
CK, MB: 1 ng/mL (ref 0.3–4.0)
RELATIVE INDEX: 0.7 (ref 0.0–2.5)
Total CK: 141 U/L (ref 7–232)

## 2013-10-27 LAB — CBC
HEMATOCRIT: 45.3 % (ref 39.0–52.0)
Hemoglobin: 15.7 g/dL (ref 13.0–17.0)
MCH: 28.3 pg (ref 26.0–34.0)
MCHC: 34.7 g/dL (ref 30.0–36.0)
MCV: 81.6 fL (ref 78.0–100.0)
PLATELETS: 162 10*3/uL (ref 150–400)
RBC: 5.55 MIL/uL (ref 4.22–5.81)
RDW: 12.9 % (ref 11.5–15.5)
WBC: 8.7 10*3/uL (ref 4.0–10.5)

## 2013-10-27 LAB — PHOSPHORUS: Phosphorus: 2.5 mg/dL (ref 2.3–4.6)

## 2013-10-27 LAB — TSH: TSH: 2.25 u[IU]/mL (ref 0.350–4.500)

## 2013-10-27 LAB — COMPREHENSIVE METABOLIC PANEL
ALT: 97 U/L — AB (ref 0–53)
AST: 34 U/L (ref 0–37)
Albumin: 3.3 g/dL — ABNORMAL LOW (ref 3.5–5.2)
Alkaline Phosphatase: 60 U/L (ref 39–117)
Anion gap: 16 — ABNORMAL HIGH (ref 5–15)
BILIRUBIN TOTAL: 1.2 mg/dL (ref 0.3–1.2)
BUN: 7 mg/dL (ref 6–23)
CHLORIDE: 99 meq/L (ref 96–112)
CO2: 22 meq/L (ref 19–32)
Calcium: 8.2 mg/dL — ABNORMAL LOW (ref 8.4–10.5)
Creatinine, Ser: 0.71 mg/dL (ref 0.50–1.35)
GFR calc Af Amer: >90 mL/min (ref >90)
Glucose, Bld: 279 mg/dL — ABNORMAL HIGH (ref 70–99)
Potassium: 3.8 mEq/L (ref 3.7–5.3)
SODIUM: 137 meq/L (ref 137–147)
Total Protein: 6.9 g/dL (ref 6.0–8.3)

## 2013-10-27 LAB — GLUCOSE, CAPILLARY
GLUCOSE-CAPILLARY: 255 mg/dL — AB (ref 70–99)
Glucose-Capillary: 263 mg/dL — ABNORMAL HIGH (ref 70–99)
Glucose-Capillary: 196 mg/dL — ABNORMAL HIGH (ref 70–99)
Glucose-Capillary: 197 mg/dL — ABNORMAL HIGH (ref 70–99)
Glucose-Capillary: 286 mg/dL — ABNORMAL HIGH (ref 70–99)

## 2013-10-27 LAB — HIV ANTIBODY (ROUTINE TESTING W REFLEX): HIV 1&2 Ab, 4th Generation: NONREACTIVE

## 2013-10-27 LAB — HEMOGLOBIN A1C
Hgb A1c MFr Bld: 13.1 % — ABNORMAL HIGH (ref <5.7)
MEAN PLASMA GLUCOSE: 329 mg/dL — AB (ref <117)

## 2013-10-27 LAB — SURGICAL PCR SCREEN
MRSA, PCR: NEGATIVE
Staphylococcus aureus: NEGATIVE

## 2013-10-27 LAB — MAGNESIUM: Magnesium: 2 mg/dL (ref 1.5–2.5)

## 2013-10-27 SURGERY — DEBRIDEMENT OF ABDOMINAL WALL ABSCESS
Anesthesia: General | Site: Abdomen

## 2013-10-27 MED ORDER — LEVOFLOXACIN IN D5W 750 MG/150ML IV SOLN
750.0000 mg | Freq: Every day | INTRAVENOUS | Status: DC
  Administered 2013-10-27 – 2013-10-28 (×3): 750 mg via INTRAVENOUS
  Filled 2013-10-27 (×4): qty 150

## 2013-10-27 MED ORDER — 0.9 % SODIUM CHLORIDE (POUR BTL) OPTIME
TOPICAL | Status: DC | PRN
  Administered 2013-10-27: 1000 mL

## 2013-10-27 MED ORDER — ONDANSETRON HCL 4 MG/2ML IJ SOLN
INTRAMUSCULAR | Status: AC
Start: 2013-10-27 — End: 2013-10-27
  Filled 2013-10-27: qty 4

## 2013-10-27 MED ORDER — KETOROLAC TROMETHAMINE 30 MG/ML IJ SOLN
INTRAMUSCULAR | Status: AC
  Filled 2013-10-27: qty 1

## 2013-10-27 MED ORDER — MORPHINE SULFATE 2 MG/ML IJ SOLN
1.0000 mg | INTRAMUSCULAR | Status: DC | PRN
  Administered 2013-10-28: 2 mg via INTRAVENOUS
  Filled 2013-10-27: qty 1

## 2013-10-27 MED ORDER — INSULIN GLARGINE 100 UNIT/ML ~~LOC~~ SOLN
10.0000 [IU] | Freq: Every day | SUBCUTANEOUS | Status: DC
  Administered 2013-10-27 (×2): 10 [IU] via SUBCUTANEOUS
  Filled 2013-10-27 (×3): qty 0.1

## 2013-10-27 MED ORDER — LIDOCAINE HCL (CARDIAC) 20 MG/ML IV SOLN
INTRAVENOUS | Status: DC | PRN
  Administered 2013-10-27: 40 mg via INTRAVENOUS
  Administered 2013-10-27: 50 mg via INTRAVENOUS

## 2013-10-27 MED ORDER — INSULIN STARTER KIT- SYRINGES (ENGLISH)
1.0000 | Freq: Once | Status: AC
  Administered 2013-10-27: 1
  Filled 2013-10-27: qty 1

## 2013-10-27 MED ORDER — MORPHINE SULFATE 4 MG/ML IJ SOLN
4.0000 mg | INTRAMUSCULAR | Status: DC | PRN
  Administered 2013-10-28: 4 mg via INTRAVENOUS
  Filled 2013-10-27: qty 1

## 2013-10-27 MED ORDER — BUPIVACAINE-EPINEPHRINE 0.5% -1:200000 IJ SOLN
INTRAMUSCULAR | Status: DC | PRN
  Administered 2013-10-27: 20 mL

## 2013-10-27 MED ORDER — INSULIN ASPART 100 UNIT/ML ~~LOC~~ SOLN
0.0000 [IU] | Freq: Every day | SUBCUTANEOUS | Status: DC
  Administered 2013-10-27 – 2013-10-28 (×2): 3 [IU] via SUBCUTANEOUS

## 2013-10-27 MED ORDER — HYDROCODONE-ACETAMINOPHEN 5-325 MG PO TABS
1.0000 | ORAL_TABLET | ORAL | Status: DC | PRN
  Administered 2013-10-27 – 2013-10-29 (×3): 2 via ORAL
  Filled 2013-10-27 (×4): qty 2

## 2013-10-27 MED ORDER — PROPOFOL 10 MG/ML IV BOLUS
INTRAVENOUS | Status: DC | PRN
  Administered 2013-10-27: 120 mg via INTRAVENOUS

## 2013-10-27 MED ORDER — FENTANYL CITRATE 0.05 MG/ML IJ SOLN
INTRAMUSCULAR | Status: AC
  Filled 2013-10-27: qty 5

## 2013-10-27 MED ORDER — INSULIN ASPART 100 UNIT/ML ~~LOC~~ SOLN
0.0000 [IU] | Freq: Three times a day (TID) | SUBCUTANEOUS | Status: DC
  Administered 2013-10-27 (×2): 11 [IU] via SUBCUTANEOUS
  Administered 2013-10-27: 4 [IU] via SUBCUTANEOUS
  Administered 2013-10-28: 18:00:00 via SUBCUTANEOUS
  Administered 2013-10-28: 11 [IU] via SUBCUTANEOUS
  Administered 2013-10-28: 7 [IU] via SUBCUTANEOUS
  Administered 2013-10-29: 4 [IU] via SUBCUTANEOUS
  Administered 2013-10-29: 7 [IU] via SUBCUTANEOUS

## 2013-10-27 MED ORDER — SUCCINYLCHOLINE CHLORIDE 20 MG/ML IJ SOLN
INTRAMUSCULAR | Status: DC | PRN
  Administered 2013-10-27: 140 mg via INTRAVENOUS

## 2013-10-27 MED ORDER — LACTATED RINGERS IV SOLN
INTRAVENOUS | Status: DC
  Administered 2013-10-27: 400 mL via INTRAVENOUS

## 2013-10-27 MED ORDER — ENOXAPARIN SODIUM 60 MG/0.6ML ~~LOC~~ SOLN
60.0000 mg | SUBCUTANEOUS | Status: DC
  Administered 2013-10-28: 60 mg via SUBCUTANEOUS
  Filled 2013-10-27 (×3): qty 0.6

## 2013-10-27 MED ORDER — PROPOFOL 10 MG/ML IV BOLUS
INTRAVENOUS | Status: AC
  Filled 2013-10-27: qty 20

## 2013-10-27 MED ORDER — ONDANSETRON HCL 4 MG/2ML IJ SOLN
INTRAMUSCULAR | Status: DC | PRN
  Administered 2013-10-27: 4 mg via INTRAVENOUS

## 2013-10-27 MED ORDER — ONDANSETRON HCL 4 MG PO TABS: 4.0000 mg | ORAL_TABLET | Freq: Four times a day (QID) | ORAL | Status: DC | PRN

## 2013-10-27 MED ORDER — LIVING WELL WITH DIABETES BOOK
Freq: Once | Status: AC
  Administered 2013-10-27: 12:00:00
  Filled 2013-10-27: qty 1

## 2013-10-27 MED ORDER — SODIUM CHLORIDE 0.9 % IV SOLN
INTRAVENOUS | Status: DC
  Administered 2013-10-27: 1000 mL via INTRAVENOUS
  Administered 2013-10-28: 06:00:00 via INTRAVENOUS
  Administered 2013-10-29: 50 mL/h via INTRAVENOUS

## 2013-10-27 MED ORDER — MIDAZOLAM HCL 5 MG/5ML IJ SOLN
INTRAMUSCULAR | Status: DC | PRN
  Administered 2013-10-27: 2 mg via INTRAVENOUS

## 2013-10-27 MED ORDER — ACETAMINOPHEN 325 MG PO TABS: 650.0000 mg | ORAL_TABLET | Freq: Four times a day (QID) | ORAL | Status: DC | PRN

## 2013-10-27 MED ORDER — LACTATED RINGERS IV SOLN
INTRAVENOUS | Status: DC | PRN
  Administered 2013-10-27: 14:00:00 via INTRAVENOUS

## 2013-10-27 MED ORDER — MIDAZOLAM HCL 2 MG/2ML IJ SOLN
INTRAMUSCULAR | Status: AC
  Filled 2013-10-27: qty 2

## 2013-10-27 MED ORDER — ACETAMINOPHEN 650 MG RE SUPP: 650.0000 mg | Freq: Four times a day (QID) | RECTAL | Status: DC | PRN

## 2013-10-27 MED ORDER — BUPIVACAINE-EPINEPHRINE (PF) 0.5% -1:200000 IJ SOLN
INTRAMUSCULAR | Status: AC
  Filled 2013-10-27: qty 30

## 2013-10-27 MED ORDER — SUCCINYLCHOLINE CHLORIDE 20 MG/ML IJ SOLN
INTRAMUSCULAR | Status: AC
  Filled 2013-10-27: qty 1

## 2013-10-27 MED ORDER — VANCOMYCIN HCL 10 G IV SOLR
2000.0000 mg | Freq: Once | INTRAVENOUS | Status: AC
  Administered 2013-10-27: 2000 mg via INTRAVENOUS
  Filled 2013-10-27: qty 2000

## 2013-10-27 MED ORDER — ENOXAPARIN SODIUM 60 MG/0.6ML ~~LOC~~ SOLN
60.0000 mg | SUBCUTANEOUS | Status: DC
  Filled 2013-10-27: qty 0.6

## 2013-10-27 MED ORDER — FENTANYL CITRATE 0.05 MG/ML IJ SOLN
INTRAMUSCULAR | Status: DC | PRN
  Administered 2013-10-27: 50 ug via INTRAVENOUS
  Administered 2013-10-27: 100 ug via INTRAVENOUS

## 2013-10-27 MED ORDER — LIDOCAINE HCL (CARDIAC) 20 MG/ML IV SOLN
INTRAVENOUS | Status: AC
  Filled 2013-10-27: qty 10

## 2013-10-27 MED ORDER — DOCUSATE SODIUM 100 MG PO CAPS
100.0000 mg | ORAL_CAPSULE | Freq: Two times a day (BID) | ORAL | Status: DC
  Administered 2013-10-27 – 2013-10-29 (×5): 100 mg via ORAL
  Filled 2013-10-27 (×8): qty 1

## 2013-10-27 MED ORDER — ONDANSETRON HCL 4 MG/2ML IJ SOLN
4.0000 mg | Freq: Four times a day (QID) | INTRAMUSCULAR | Status: DC | PRN
  Administered 2013-10-27: 4 mg via INTRAVENOUS
  Filled 2013-10-27: qty 2

## 2013-10-27 MED ORDER — VANCOMYCIN HCL 10 G IV SOLR
1250.0000 mg | Freq: Three times a day (TID) | INTRAVENOUS | Status: DC
  Administered 2013-10-27 – 2013-10-29 (×7): 1250 mg via INTRAVENOUS
  Filled 2013-10-27 (×9): qty 1250

## 2013-10-27 SURGICAL SUPPLY — 41 items
BLADE SURG ROTATE 9660 (MISCELLANEOUS) IMPLANT
BNDG GAUZE ELAST 4 BULKY (GAUZE/BANDAGES/DRESSINGS) ×2 IMPLANT
CANISTER SUCTION 2500CC (MISCELLANEOUS) ×2 IMPLANT
COVER SURGICAL LIGHT HANDLE (MISCELLANEOUS) ×2 IMPLANT
DRAPE LAPAROSCOPIC ABDOMINAL (DRAPES) ×2 IMPLANT
DRAPE UTILITY 15X26 W/TAPE STR (DRAPE) ×4 IMPLANT
ELECT BLADE 6.5 EXT (BLADE) IMPLANT
ELECT CAUTERY BLADE 6.4 (BLADE) ×2 IMPLANT
ELECT REM PT RETURN 9FT ADLT (ELECTROSURGICAL) ×3
ELECTRODE REM PT RTRN 9FT ADLT (ELECTROSURGICAL) IMPLANT
GLOVE BIOGEL PI IND STRL 7.0 (GLOVE) IMPLANT
GLOVE BIOGEL PI IND STRL 7.5 (GLOVE) IMPLANT
GLOVE BIOGEL PI INDICATOR 7.0 (GLOVE) ×4
GLOVE BIOGEL PI INDICATOR 7.5 (GLOVE) ×2
GLOVE SURG SIGNA 7.5 PF LTX (GLOVE) ×2 IMPLANT
GLOVE SURG SS PI 7.0 STRL IVOR (GLOVE) ×4 IMPLANT
GOWN STRL REUS W/ TWL LRG LVL3 (GOWN DISPOSABLE) IMPLANT
GOWN STRL REUS W/ TWL XL LVL3 (GOWN DISPOSABLE) IMPLANT
GOWN STRL REUS W/TWL LRG LVL3 (GOWN DISPOSABLE) ×3
GOWN STRL REUS W/TWL XL LVL3 (GOWN DISPOSABLE) ×6
KIT BASIN OR (CUSTOM PROCEDURE TRAY) ×2 IMPLANT
KIT ROOM TURNOVER OR (KITS) ×2 IMPLANT
NDL HYPO 25GX1X1/2 BEV (NEEDLE) IMPLANT
NEEDLE HYPO 25GX1X1/2 BEV (NEEDLE) ×3 IMPLANT
PACK GENERAL/GYN (CUSTOM PROCEDURE TRAY) ×2 IMPLANT
PAD ABD 8X10 STRL (GAUZE/BANDAGES/DRESSINGS) ×2 IMPLANT
PAD ARMBOARD 7.5X6 YLW CONV (MISCELLANEOUS) ×2 IMPLANT
SPONGE LAP 18X18 X RAY DECT (DISPOSABLE) IMPLANT
STAPLER VISISTAT 35W (STAPLE) IMPLANT
SUCTION POOLE TIP (SUCTIONS) IMPLANT
SUT PDS AB 1 TP1 96 (SUTURE) IMPLANT
SUT SILK 2 0 SH CR/8 (SUTURE) IMPLANT
SUT SILK 2 0 TIES 10X30 (SUTURE) IMPLANT
SUT SILK 3 0 SH CR/8 (SUTURE) IMPLANT
SUT SILK 3 0 TIES 10X30 (SUTURE) IMPLANT
SWAB CULTURE LIQUID MINI MALE (MISCELLANEOUS) IMPLANT
SYR CONTROL 10ML LL (SYRINGE) ×2 IMPLANT
TAPE CLOTH SURG 6X10 WHT LF (GAUZE/BANDAGES/DRESSINGS) ×2 IMPLANT
TOWEL OR 17X26 10 PK STRL BLUE (TOWEL DISPOSABLE) ×2 IMPLANT
TRAY FOLEY CATH 16FRSI W/METER (SET/KITS/TRAYS/PACK) IMPLANT
TUBE ANAEROBIC SPECIMEN COL (MISCELLANEOUS) ×2 IMPLANT

## 2013-10-27 NOTE — Progress Notes (Signed)
ANTIBIOTIC CONSULT NOTE - INITIAL  Pharmacy Consult for Vancomycin and Levaquin Indication: cellulitis  Allergies  Allergen Reactions  . Amoxicillin Rash    Patient Measurements: Height: 5' 10.8" (179.8 cm) Weight: 279 lb 14.4 oz (126.962 kg) IBW/kg (Calculated) : 74.84 Adjusted Body Weight: 100 kg  Vital Signs: Temp: 99.4 F (37.4 C) (07/14 0020) Temp src: Oral (07/14 0020) BP: 151/89 mmHg (07/14 0020) Pulse Rate: 106 (07/14 0020) Intake/Output from previous day:   Intake/Output from this shift:    Labs:  Recent Labs  10/26/13 2034  WBC 9.0  HGB 16.9  PLT 183  CREATININE 0.74   Estimated Creatinine Clearance: 186.1 ml/min (by C-G formula based on Cr of 0.74). No results found for this basename: VANCOTROUGH, VANCOPEAK, VANCORANDOM, GENTTROUGH, GENTPEAK, GENTRANDOM, TOBRATROUGH, TOBRAPEAK, TOBRARND, AMIKACINPEAK, AMIKACINTROU, AMIKACIN,  in the last 72 hours   Microbiology: No results found for this or any previous visit (from the past 720 hour(s)).  Medical History: Past Medical History  Diagnosis Date  . Diabetes mellitus without complication   . Hypertension     Medications:  Prescriptions prior to admission  Medication Sig Dispense Refill  . hydrochlorothiazide (HYDRODIURIL) 25 MG tablet Take 25 mg by mouth daily.      . metFORMIN (GLUCOPHAGE) 850 MG tablet Take 850 mg by mouth 2 (two) times daily with a meal.       Assessment: 28 yo male with abdominal wall cellulitis/abcess for empiric antibiotics  Goal of Therapy:  Vancomycin trough level 10-15 mcg/ml  Plan:  Vancomycin 2 g IV now, then 1250 mg IV q8h Levaquin 750 mg IV q24h  Eddie Candlebbott, Gor Vestal Vernon 10/27/2013,12:36 AM

## 2013-10-27 NOTE — Anesthesia Procedure Notes (Signed)
Procedure Name: Intubation Date/Time: 10/27/2013 2:08 PM Performed by: Arlice ColtMANESS, Carina Chaplin B Pre-anesthesia Checklist: Emergency Drugs available, Patient identified, Patient being monitored, Suction available and Timeout performed Patient Re-evaluated:Patient Re-evaluated prior to inductionOxygen Delivery Method: Circle system utilized Preoxygenation: Pre-oxygenation with 100% oxygen Intubation Type: IV induction and Rapid sequence Laryngoscope Size: Mac and 3 Grade View: Grade II Tube type: Oral Tube size: 7.5 mm Number of attempts: 2 Airway Equipment and Method: Stylet Placement Confirmation: ETT inserted through vocal cords under direct vision,  positive ETCO2 and breath sounds checked- equal and bilateral Secured at: 23 cm Tube secured with: Tape Dental Injury: Teeth and Oropharynx as per pre-operative assessment

## 2013-10-27 NOTE — Op Note (Signed)
I&D ABDOMINAL WALL ABSCESS  Procedure Note  William PorchLance Counts 10/26/2013 - 10/27/2013   Pre-op Diagnosis: Abdominal Wall Abscess     Post-op Diagnosis: same  Procedure(s): I&D ABDOMINAL WALL ABSCESS  Surgeon(s): Shelly Rubensteinouglas A Darcus Edds, MD  Anesthesia: General  Staff:  Circulator: Gerre PebblesJamie Elizabeth Sipsis, RN Scrub Person: Leighton ParodyAnn Marie Wilson, CST; Derrek GuKelly R Woods Circulator Assistant: Pauletta BrownsMegan Day Cavanaugh, RN  Estimated Blood Loss: Minimal               Cultures sent          [facility]ffer Surgical CenterBLACKMAN,[name]   Date: 10/27/2013  Time: 2:28 PM

## 2013-10-27 NOTE — Consult Note (Signed)
I have seen and examined the patient and agree with the assessment and plans. Plan I and D of abscess in the OR today.  I discussed the risks with him in detail.  Inetha Maret A. Magnus IvanBlackman  MD, FACS

## 2013-10-27 NOTE — Anesthesia Preprocedure Evaluation (Addendum)
Anesthesia Evaluation  Patient identified by MRN, date of birth, ID band Patient awake    Reviewed: Allergy & Precautions, H&P , NPO status , Patient's Chart, lab work & pertinent test results  Airway Mallampati: II TM Distance: >3 FB Neck ROM: Full    Dental  (+) Teeth Intact, Dental Advisory Given   Pulmonary neg pulmonary ROS,          Cardiovascular hypertension, Pt. on medications     Neuro/Psych negative neurological ROS     GI/Hepatic negative GI ROS, Neg liver ROS,   Endo/Other  diabetes, Type 2, Insulin Dependent  Renal/GU negative Renal ROS     Musculoskeletal   Abdominal (+) + obese,   Peds  Hematology negative hematology ROS (+)   Anesthesia Other Findings   Reproductive/Obstetrics                          Anesthesia Physical Anesthesia Plan  ASA: II  Anesthesia Plan: General   Post-op Pain Management:    Induction: Intravenous, Rapid sequence and Cricoid pressure planned  Airway Management Planned: Oral ETT  Additional Equipment:   Intra-op Plan:   Post-operative Plan: Extubation in OR  Informed Consent: I have reviewed the patients History and Physical, chart, labs and discussed the procedure including the risks, benefits and alternatives for the proposed anesthesia with the patient or authorized representative who has indicated his/her understanding and acceptance.   Dental advisory given  Plan Discussed with: CRNA and Surgeon  Anesthesia Plan Comments:        Anesthesia Quick Evaluation

## 2013-10-27 NOTE — Progress Notes (Signed)
Utilization review completed.  

## 2013-10-27 NOTE — Progress Notes (Signed)
[facility] PROGRESS NOTE  William Sanders ZOX:[id number] DOB: [birth year] DOA: 10/26/2013 PCP: Pcp Not In System  Assessment/Plan: 1-Abdominal wall cellulitis, Abscess/phlemog ? Continue with Levaquin and vancomycin. Surgery consulted. Check Total CK.   2-Diabetes; Continue with lantus. Check Hb A1c to determine if patient will need insulin at discharge.  3-DVT prophylaxis; Lovenox.  4-Sepsis; fever, tachypnea, source infection cellulitis, continue with IV fluids, IV antibiotics.  5-Screening HIV.   Code Status: full code.  Family Communication: care discussed with patient.  Disposition Plan: remain inpatient.    Consultants:  Surgery.   Procedures:  none  Antibiotics:  Levaquin 7-14  Vancomycin 7-14  HPI/Subjective: Feeling ok, started to take medication for diabetes one week ago due to insurance problem. He is still complaining of pain, he is not sure if swelling has improved.   Objective: Filed Vitals:   10/27/13 0437  BP: 127/80  Pulse: 88  Temp: 98.7 F (37.1 C)  Resp: 18   No intake or output data in the 24 hours ending 10/27/13 0800 Filed Weights   10/26/13 2029 10/27/13 0020  Weight: 130.636 kg (288 lb) 126.962 kg (279 lb 14.4 oz)    Exam:   General:  No distress.   Cardiovascular: S 1, S 2 RRR  Respiratory: CTA  Abdomen: Bs present, soft, redness lower quadrant, mild induration.   Musculoskeletal: no edema.   Data Reviewed: Basic Metabolic Panel:  Recent Labs Lab 10/26/13 2034 10/27/13 0535  NA 134* 137  K 3.8 3.8  CL 94* 99  CO2 21 22  GLUCOSE 403* 279*  BUN 7 7  CREATININE 0.74 0.71  CALCIUM 9.1 8.2*  MG  --  2.0  PHOS  --  2.5   Liver Function Tests:  Recent Labs Lab 10/26/13 2034 10/27/13 0535  AST 46* 34  ALT 123* 97*  ALKPHOS 68 60  BILITOT 0.9 1.2  PROT 8.1 6.9  ALBUMIN 4.0 3.3*   No results found for this basename: LIPASE, AMYLASE,  in the last 168 hours No results found for this basename: AMMONIA,  in  the last 168 hours CBC:  Recent Labs Lab 10/26/13 2034 10/27/13 0535  WBC 9.0 8.7  NEUTROABS 5.3  --   HGB 16.9 15.7  HCT 47.5 45.3  MCV 80.6 81.6  PLT 183 162   Cardiac Enzymes: No results found for this basename: CKTOTAL, CKMB, CKMBINDEX, TROPONINI,  in the last 168 hours BNP (last 3 results) No results found for this basename: PROBNP,  in the last 8760 hours CBG:  Recent Labs Lab 10/26/13 2312 10/27/13 0757  GLUCAP 255* 263*    No results found for this or any previous visit (from the past 240 hour(s)).   Studies: Ct Abdomen Pelvis W Contrast  10/26/2013   CLINICAL DATA:  Abdominal wall cellulitis  EXAM: CT ABDOMEN AND PELVIS WITH CONTRAST  TECHNIQUE: Multidetector CT imaging of the abdomen and pelvis was performed using the standard protocol following bolus administration of intravenous contrast.  CONTRAST:  OMNIPAQUE IOHEXOL 300 MG/ML  SOLN  COMPARISON:  None.  FINDINGS: Lung bases are clear.  Liver is enlarged measuring 20.6 cm in length. There is diffuse fatty change in the liver. No focal liver lesions are appreciated. There is no appreciable biliary duct dilatation. Gallbladder wall is not thickened.  Spleen, pancreas, and adrenals appear normal. Kidneys bilaterally show no mass or hydronephrosis on either side. There is no renal or ureteral calculus on either side. There is Sanders retroaortic left renal vein, an  anatomic variant.  There is thickening of the subcutaneous tissue in the anterior mid to lower abdominal and pelvic wall regions. There is Sanders fluid collection in the anterior left pelvic wall with surrounding inflammation measuring 4.9 x 3.2 cm. On the right in this same area, there is Sanders smaller thick-walled fluid collection measuring 1.9 x 1.9 cm in the subcutaneous region. Both of these lesions are best seen on axial slice 74 series 201.  In the pelvis, the urinary bladder is midline with normal wall thickness. There is no pelvic mass or fluid collection. There are  small inguinal lymph nodes which do not meet size criteria for pathologic significance. The appendix appears normal.  There is no bowel obstruction.  No free air or portal venous air.  There is no ascites or adenopathy in the abdomen or pelvis. There is no intraperitoneal or retroperitoneal abscess. There is no evidence of abdominal aortic aneurysm. There are no blastic or lytic bone lesions.  IMPRESSION: Anterior lower abdominal and pelvic wall cellulitis. At the level of the mid to lower pelvis, there is Sanders phlegmon in the anterior left pelvic wall measuring 4.9 x 3.2 cm. At this same level on the right, there is Sanders 1.9 x 1.9 cm phlegmon in the subcutaneous region of the anterior pelvic wall as well.  Liver is enlarged with fatty change.  Appendix appears normal. No bowel obstruction. No intraperitoneal or retroperitoneal abscess. No renal or ureteral calculus. No hydronephrosis.   Electronically Signed   By: Bretta BangWilliam  Woodruff M.D.   On: 10/26/2013 22:19    Scheduled Meds: . sodium chloride   Intravenous STAT  . docusate sodium  100 mg Oral BID  . enoxaparin (LOVENOX) injection  60 mg Subcutaneous Q24H  . insulin aspart  0-20 Units Subcutaneous TID WC  . insulin aspart  0-5 Units Subcutaneous QHS  . insulin glargine  10 Units Subcutaneous QHS  . levofloxacin (LEVAQUIN) IV  750 mg Intravenous QHS  . sodium chloride  1,000 mL Intravenous Once  . vancomycin  1,250 mg Intravenous Q8H   Continuous Infusions:   Active Problems:   Abdominal wall cellulitis   Sepsis   Type II or unspecified type diabetes mellitus with unspecified complication, uncontrolled    Time spent: 25 minutes.     William Sanders, William Sanders  [facility] Pager [phone]. If 7PM-7AM, please contact night-coverage at [email or web address], password [email or web address] 10/27/2013, 8:00 AM  LOS: 1 day

## 2013-10-27 NOTE — Transfer of Care (Signed)
Immediate Anesthesia Transfer of Care Note  Patient: William PorchLance Sanders  Procedure(s) Performed: Procedure(s): I&D ABDOMINAL WALL ABSCESS (N/A)  Patient Location: PACU  Anesthesia Type:General  Level of Consciousness: awake, alert  and oriented  Airway & Oxygen Therapy: Patient Spontanous Breathing  Post-op Assessment: Report given to PACU RN and Post -op Vital signs reviewed and stable  Post vital signs: Reviewed and stable  Complications: No apparent anesthesia complications

## 2013-10-27 NOTE — Progress Notes (Addendum)
Inpatient Diabetes Program Recommendations  AACE/ADA: New Consensus Statement on Inpatient Glycemic Control (2013)  Target Ranges:  Prepandial:   less than 140 mg/dL      Peak postprandial:   less than 180 mg/dL (1-2 hours)      Critically ill patients:  140 - 180 mg/dL     Results for KALYB, PEMBLE (MRN [id number]) as of 10/27/2013 09:08  Ref. Range 10/26/2013 23:12 10/27/2013 07:57  Glucose-Capillary Latest Range: 70-99 mg/dL 255 (H) 263 (H)     Admitted with Abdominal Wall Cellulitis.  History of DM, HTN.  Home DM Meds: Metformin 850 mg bid    **Patient started on Lantus 10 units QHS at 1am.  Patient also started on Novolog Resistant SSI at midnight.  **Note A1c pending- Patient may need insulin at time of discharge   MD- Please consider increasing Lantus to 25 units QHS (this would be 0.2 units/kg dosing based on pt weight of 127 kg)  MD- After speaking with patient today, patient states he will not be able to afford expensive insulin or other medications after d/c.  If decision made to send pt home on insulin, please consider Reli-on 70/30 insulin from [employer] (this insulin is $25 per vial).  Patient very hesitant to start insulin but will have RNs begin insulin teaching in case decision made to send patient home on insulin    Addendum 1215pm: Patient told me he was diagnosed with DM at age 28.  Supposedly has been taking Metformin 850 mg bid since his diagnosis 12 years ago.  Has not had regular medical care.  Used to go to [facility] and then was being seen at [facility] here in [place].  Patient unclear when his last visit with PCP was. Needs PCP he can regularly see for DM Management.  Patient stated he was unsure if his [employer] was active or not.  Will ask care management to look into this for patient.  Patient has not checked his CBGs in over 6 months and has no idea what his sugars have been running.  Admits to drinking regular  soda and lots of fruit juice.  Discussed with patient and his mother the extreme importance of avoiding fruit juice and regular soda due to the high content of carbohydrates in these beverages. Also discussed importance of appropriate portion sizes with carbohydrate containing foods. Will ask RD to see pt while here in hospital for further dietary education.  Spoke with patient and his mother this AM.  Explained what an A1C is, basic pathophysiology of DM Type 2, basic home care, importance of checking CBGs and maintaining good CBG control to prevent long-term and short-term complications.  Reviewed signs and symptoms of hyperglycemia and hypoglycemia.  RNs to provide ongoing basic DM education at bedside with this patient.  Have ordered educational booklet, insulin starter kit, and DM videos.  Patient stated he has limited income and will not be able to afford expensive insulin (has lots of medical bills).  Patient is also extremely hesitant to start insulin.  Patient told me he was "afraid of needles".  I told patient I would leave this information with his MD here in the hospital but that the MD would likely use his A1c results to determine the best course of care for him.  Also gave patient and his mom information on purchasing inexpensive CBG OTC at [facility].  Encouraged patient to try to check his CBGs at least bid at home.  Wyn Quaker RN, MSN, CDE Diabetes Coordinator Inpatient Diabetes Program Team Pager: [phone] (8a-10p)

## 2013-10-27 NOTE — Consult Note (Signed)
William Sanders 15-Feb-1986  [id number].   Primary Care MD: Karoline Caldwell Requesting MD: Dr. Niel Hummer Chief Complaint/Reason for Consult: Abdominal wall abscess HPI: This is a very pleasant 28 year old black male who noticed some redness in his left groin on Saturday. It was somewhat tender. He expect get better and did not pursue any further treatment. On Sunday it continued to worsen but not significantly. The patient continued to think it would improve on its own. By Monday the patient was having a stinging sensation in his groin along with worsening erythema. The patient did have some chills. He did not notice a fever at home; however, upon arrival to the hospital, he was noted to have a fever of 101.6. Due to persistent pain and worsening, the patient did present to [facility] emergency department where he was evaluated and admitted for further medical management. He had a CT scan that revealed an abdominal wall abscess on the left lower quadrant area just under his pannus. We have been asked to evaluate the patient today for further evaluation.  ROS please see history of present illness otherwise all other systems have been reviewed and are negative.  Family History  Problem Relation Age of Onset  . HIV Mother   . CAD Father     Past Medical History  Diagnosis Date  . Diabetes mellitus without complication   . Hypertension     History reviewed. No pertinent past surgical history.  Social History:  reports that he has never smoked. He does not have any smokeless tobacco history on file. He reports that he does not drink alcohol or use illicit drugs.  Allergies:  Allergies  Allergen Reactions  . Amoxicillin Rash    Medications Prior to Admission  Medication Sig Dispense Refill  . hydrochlorothiazide (HYDRODIURIL) 25 MG tablet Take 25 mg by mouth daily.      . metFORMIN (GLUCOPHAGE) 850 MG tablet Take 850 mg by mouth 2 (two) times daily with a meal.        Blood pressure  127/80, pulse 88, temperature 98.7 F (37.1 C), temperature source Oral, resp. rate 18, height 5' 10.8" (1.798 m), weight 279 lb 14.4 oz (126.962 kg), SpO2 100.00%. Physical Exam: General: pleasant, WD, WN black male who is laying in bed in NAD HEENT: head is normocephalic, atraumatic.  Sclera are noninjected.  PERRL.  Ears and nose without any masses or lesions.  Mouth is pink and moist Heart: regular, rate, and rhythm.  Normal s1,s2. No obvious murmurs, gallops, or rubs noted.  Palpable radial and pedal pulses bilaterally Lungs: CTAB, no wheezes, rhonchi, or rales noted.  Respiratory effort nonlabored Abd: soft, NT except and lower abdomen where abscess and cellulitis are present, ND, +BS, no masses, hernias, or organomegaly. He has an area of fluctuants noted right at the crease of where his pannus meets his mons. He has cellulitis and erythema that extends from this area up into his pannus. This is warm to palpation. There is also some induration noted. MS: all 4 extremities are symmetrical with no cyanosis, clubbing, or edema. Skin: warm and dry with no masses, lesions, or rashes Psych: A&Ox3 with an appropriate affect.    Results for orders placed during the hospital encounter of 10/26/13 (from the past 48 hour(s))  CBC WITH DIFFERENTIAL     Status: Abnormal   Collection Time    10/26/13  8:34 PM      Result Value Ref Range   WBC 9.0  4.0 - 10.5 K/uL  RBC 5.89 (*) 4.22 - 5.81 MIL/uL   Hemoglobin 16.9  13.0 - 17.0 g/dL   HCT 47.5  39.0 - 52.0 %   MCV 80.6  78.0 - 100.0 fL   MCH 28.7  26.0 - 34.0 pg   MCHC 35.6  30.0 - 36.0 g/dL   RDW 12.9  11.5 - 15.5 %   Platelets 183  150 - 400 K/uL   Neutrophils Relative % 59  43 - 77 %   Neutro Abs 5.3  1.7 - 7.7 K/uL   Lymphocytes Relative 30  12 - 46 %   Lymphs Abs 2.7  0.7 - 4.0 K/uL   Monocytes Relative 11  3 - 12 %   Monocytes Absolute 1.0  0.1 - 1.0 K/uL   Eosinophils Relative 0  0 - 5 %   Eosinophils Absolute 0.0  0.0 - 0.7 K/uL    Basophils Relative 0  0 - 1 %   Basophils Absolute 0.0  0.0 - 0.1 K/uL  COMPREHENSIVE METABOLIC PANEL     Status: Abnormal   Collection Time    10/26/13  8:34 PM      Result Value Ref Range   Sodium 134 (*) 137 - 147 mEq/L   Potassium 3.8  3.7 - 5.3 mEq/L   Chloride 94 (*) 96 - 112 mEq/L   CO2 21  19 - 32 mEq/L   Glucose, Bld 403 (*) 70 - 99 mg/dL   BUN 7  6 - 23 mg/dL   Creatinine, Ser 0.74  0.50 - 1.35 mg/dL   Calcium 9.1  8.4 - 10.5 mg/dL   Total Protein 8.1  6.0 - 8.3 g/dL   Albumin 4.0  3.5 - 5.2 g/dL   AST 46 (*) 0 - 37 U/L   ALT 123 (*) 0 - 53 U/L   Alkaline Phosphatase 68  39 - 117 U/L   Total Bilirubin 0.9  0.3 - 1.2 mg/dL   GFR calc non Af Amer >90  >90 mL/min   GFR calc Af Amer >90  >90 mL/min   Comment: (NOTE)     The eGFR has been calculated using the CKD EPI equation.     This calculation has not been validated in all clinical situations.     eGFR's persistently <90 mL/min signify possible Chronic Kidney     Disease.   Anion gap 19 (*) 5 - 15  I-STAT CG4 LACTIC ACID, ED     Status: None   Collection Time    10/26/13  9:19 PM      Result Value Ref Range   Lactic Acid, Venous 2.14  0.5 - 2.2 mmol/L  CBG MONITORING, ED     Status: Abnormal   Collection Time    10/26/13 11:12 PM      Result Value Ref Range   Glucose-Capillary 255 (*) 70 - 99 mg/dL  MAGNESIUM     Status: None   Collection Time    10/27/13  5:35 AM      Result Value Ref Range   Magnesium 2.0  1.5 - 2.5 mg/dL  PHOSPHORUS     Status: None   Collection Time    10/27/13  5:35 AM      Result Value Ref Range   Phosphorus 2.5  2.3 - 4.6 mg/dL  TSH     Status: None   Collection Time    10/27/13  5:35 AM      Result Value Ref Range   TSH 2.250  0.350 - 4.500 uIU/mL  COMPREHENSIVE METABOLIC PANEL     Status: Abnormal   Collection Time    10/27/13  5:35 AM      Result Value Ref Range   Sodium 137  137 - 147 mEq/L   Potassium 3.8  3.7 - 5.3 mEq/L   Chloride 99  96 - 112 mEq/L   CO2 22  19 - 32  mEq/L   Glucose, Bld 279 (*) 70 - 99 mg/dL   BUN 7  6 - 23 mg/dL   Creatinine, Ser 0.71  0.50 - 1.35 mg/dL   Calcium 8.2 (*) 8.4 - 10.5 mg/dL   Total Protein 6.9  6.0 - 8.3 g/dL   Albumin 3.3 (*) 3.5 - 5.2 g/dL   AST 34  0 - 37 U/L   ALT 97 (*) 0 - 53 U/L   Alkaline Phosphatase 60  39 - 117 U/L   Total Bilirubin 1.2  0.3 - 1.2 mg/dL   GFR calc non Af Amer >90  >90 mL/min   GFR calc Af Amer >90  >90 mL/min   Comment: (NOTE)     The eGFR has been calculated using the CKD EPI equation.     This calculation has not been validated in all clinical situations.     eGFR's persistently <90 mL/min signify possible Chronic Kidney     Disease.   Anion gap 16 (*) 5 - 15  CBC     Status: None   Collection Time    10/27/13  5:35 AM      Result Value Ref Range   WBC 8.7  4.0 - 10.5 K/uL   RBC 5.55  4.22 - 5.81 MIL/uL   Hemoglobin 15.7  13.0 - 17.0 g/dL   HCT 45.3  39.0 - 52.0 %   MCV 81.6  78.0 - 100.0 fL   MCH 28.3  26.0 - 34.0 pg   MCHC 34.7  30.0 - 36.0 g/dL   RDW 12.9  11.5 - 15.5 %   Platelets 162  150 - 400 K/uL  GLUCOSE, CAPILLARY     Status: Abnormal   Collection Time    10/27/13  7:57 AM      Result Value Ref Range   Glucose-Capillary 263 (*) 70 - 99 mg/dL   Ct Abdomen Pelvis W Contrast  10/26/2013   CLINICAL DATA:  Abdominal wall cellulitis  EXAM: CT ABDOMEN AND PELVIS WITH CONTRAST  TECHNIQUE: Multidetector CT imaging of the abdomen and pelvis was performed using the standard protocol following bolus administration of intravenous contrast.  CONTRAST:  197mL OMNIPAQUE IOHEXOL 300 MG/ML  SOLN  COMPARISON:  None.  FINDINGS: Lung bases are clear.  Liver is enlarged measuring 20.6 cm in length. There is diffuse fatty change in the liver. No focal liver lesions are appreciated. There is no appreciable biliary duct dilatation. Gallbladder wall is not thickened.  Spleen, pancreas, and adrenals appear normal. Kidneys bilaterally show no mass or hydronephrosis on either side. There is no  renal or ureteral calculus on either side. There is a retroaortic left renal vein, an anatomic variant.  There is thickening of the subcutaneous tissue in the anterior mid to lower abdominal and pelvic wall regions. There is a fluid collection in the anterior left pelvic wall with surrounding inflammation measuring 4.9 x 3.2 cm. On the right in this same area, there is a smaller thick-walled fluid collection measuring 1.9 x 1.9 cm in the subcutaneous region. Both of these lesions are best seen  on axial slice 74 series 215.  In the pelvis, the urinary bladder is midline with normal wall thickness. There is no pelvic mass or fluid collection. There are small inguinal lymph nodes which do not meet size criteria for pathologic significance. The appendix appears normal.  There is no bowel obstruction.  No free air or portal venous air.  There is no ascites or adenopathy in the abdomen or pelvis. There is no intraperitoneal or retroperitoneal abscess. There is no evidence of abdominal aortic aneurysm. There are no blastic or lytic bone lesions.  IMPRESSION: Anterior lower abdominal and pelvic wall cellulitis. At the level of the mid to lower pelvis, there is a phlegmon in the anterior left pelvic wall measuring 4.9 x 3.2 cm. At this same level on the right, there is a 1.9 x 1.9 cm phlegmon in the subcutaneous region of the anterior pelvic wall as well.  Liver is enlarged with fatty change.  Appendix appears normal. No bowel obstruction. No intraperitoneal or retroperitoneal abscess. No renal or ureteral calculus. No hydronephrosis.   Electronically Signed   By: Lowella Grip M.D.   On: 10/26/2013 22:19       Assessment/Plan 1. Abdominal wall abscess and cellulitis 2. Diabetes mellitus 3. Hypertension  Plan: 1. I will make the patient n.p.o. with plans for surgical intervention. This may be able to be done at the bedside however given the amount of erythema and cellulitis he has, I think this would be best  served in the operating room where an incision and drainage can be completed while the patient is asleep. The patient is agreeable with this plan.  2. Agree with antibiotic therapy. 3. I discussed wound care with the patient and what to expect following his operation. The patient understands and is agreeable to proceed.  Simra Fiebig E 10/27/2013, 9:26 AM Pager: [phone]

## 2013-10-27 NOTE — H&P (Signed)
PCP:  [facility]   Chief Complaint:  Abdominal swelling HPI: William Sanders is a 28 y.o. male   has a past medical history of Diabetes mellitus without complication and Hypertension.   Presented with  Patient have  2 days hx of swelling and redness of the lower abdomen that has been getting worse. He denies any fever but had some chills. IN ER he was found to have a fever up to 101.6. Patient has not been able to take his medications due to financial problems. CT showed two large phlegmon. ER discussed with surgery who at this point recommend IV antibiotics.   Hospitalist was called for admission for  cellulitis  Review of Systems:    Pertinent positives include:  Fevers, chills,  Constitutional:  No weight loss, night sweats,  fatigue, weight loss  HEENT:  No headaches, Difficulty swallowing,Tooth/dental problems,Sore throat,  No sneezing, itching, ear ache, nasal congestion, post nasal drip,  Cardio-vascular:  No chest pain, Orthopnea, PND, anasarca, dizziness, palpitations.no Bilateral lower extremity swelling  GI:  No heartburn, indigestion, abdominal pain, nausea, vomiting, diarrhea, change in bowel habits, loss of appetite, melena, blood in stool, hematemesis Resp:  no shortness of breath at rest. No dyspnea on exertion, No excess mucus, no productive cough, No non-productive cough, No coughing up of blood.No change in color of mucus.No wheezing. Skin:  no rash or lesions. No jaundice GU:  no dysuria, change in color of urine, no urgency or frequency. No straining to urinate.  No flank pain.  Musculoskeletal:  No joint pain or no joint swelling. No decreased range of motion. No back pain.  Psych:  No change in mood or affect. No depression or anxiety. No memory loss.  Neuro: no localizing neurological complaints, no tingling, no weakness, no double vision, no gait abnormality, no slurred speech, no confusion  Otherwise ROS are negative except for above, 10 systems  were reviewed  Past Medical History: Past Medical History  Diagnosis Date  . Diabetes mellitus without complication   . Hypertension    History reviewed. No pertinent past surgical history.   Medications: Prior to Admission medications   Medication Sig Start Date End Date Taking? Authorizing Provider  hydrochlorothiazide (HYDRODIURIL) 25 MG tablet Take 25 mg by mouth daily.   Yes Historical Provider, MD  metFORMIN (GLUCOPHAGE) 850 MG tablet Take 850 mg by mouth 2 (two) times daily with a meal.   Yes Historical Provider, MD    Allergies:   Allergies  Allergen Reactions  . Amoxicillin Rash    Social History:  Ambulatory   independently   Lives at home  With family     reports that he has never smoked. He does not have any smokeless tobacco history on file. He reports that he does not drink alcohol or use illicit drugs.    Family History: family history includes CAD in his father; HIV in his mother.    Physical Exam: Patient Vitals for the past 24 hrs:  BP Temp Temp src Pulse Resp SpO2 Height Weight  10/27/13 0020 151/89 mmHg 99.4 F (37.4 C) Oral 106 18 98 % 5' 10.8" (1.798 m) 126.962 kg (279 lb 14.4 oz)  10/26/13 2330 138/76 mmHg - - 103 20 97 % - -  10/26/13 2300 142/81 mmHg - - 105 24 94 % - -  10/26/13 2230 128/72 mmHg - - 107 15 98 % - -  10/26/13 2223 - 99.3 F (37.4 C) Oral - - - - -  10/26/13 2130  125/67 mmHg - - 109 29 97 % - -  10/26/13 2100 119/60 mmHg - - 114 20 98 % - -  10/26/13 2029 145/89 mmHg 101.6 F (38.7 C) Oral 115 16 97 % 5\' 9"  (1.753 m) 130.636 kg (288 lb)    1. General:  in No Acute distress 2. Psychological: Alert and  Oriented 3. Head/ENT:   Moist   Mucous Membranes                          Head Non traumatic, neck supple                          Normal  Dentition 4. SKIN: normal   Skin turgor,  Skin clean Dry redness and induration present over lower abdomen 5. Heart: Regular rate and rhythm no Murmur, Rub or gallop 6. Lungs: Clear  to auscultation bilaterally, no wheezes or crackles   7. Abdomen: Soft, non-tender, Non distended 8. Lower extremities: no clubbing, cyanosis, or edema 9. Neurologically Grossly intact, moving all 4 extremities equally 10. MSK: Normal range of motion  body mass index is 39.27 kg/(m^2).   Labs on Admission:   Recent Labs  10/26/13 2034  NA 134*  K 3.8  CL 94*  CO2 21  GLUCOSE 403*  BUN 7  CREATININE 0.74  CALCIUM 9.1    Recent Labs  10/26/13 2034  AST 46*  ALT 123*  ALKPHOS 68  BILITOT 0.9  PROT 8.1  ALBUMIN 4.0   No results found for this basename: LIPASE, AMYLASE,  in the last 72 hours  Recent Labs  10/26/13 2034  WBC 9.0  NEUTROABS 5.3  HGB 16.9  HCT 47.5  MCV 80.6  PLT 183   No results found for this basename: CKTOTAL, CKMB, CKMBINDEX, TROPONINI,  in the last 72 hours No results found for this basename: TSH, T4TOTAL, FREET3, T3FREE, THYROIDAB,  in the last 72 hours No results found for this basename: VITAMINB12, FOLATE, FERRITIN, TIBC, IRON, RETICCTPCT,  in the last 72 hours No results found for this basename: HGBA1C    Estimated Creatinine Clearance: 186.1 ml/min (by C-G formula based on Cr of 0.74). ABG    Component Value Date/Time   TCO2 26 01/25/2012 1208     No results found for this basename: DDIMER     Other results: BNP (last 3 results) No results found for this basename: PROBNP,  in the last 8760 hours  Filed Weights   10/26/13 2029 10/27/13 0020  Weight: 130.636 kg (288 lb) 126.962 kg (279 lb 14.4 oz)     Cultures: No results found for this basename: sdes, specrequest, cult, reptstatus    Radiological Exams on Admission: Ct Abdomen Pelvis W Contrast  10/26/2013   CLINICAL DATA:  Abdominal wall cellulitis  EXAM: CT ABDOMEN AND PELVIS WITH CONTRAST  TECHNIQUE: Multidetector CT imaging of the abdomen and pelvis was performed using the standard protocol following bolus administration of intravenous contrast.  CONTRAST:  100mL  OMNIPAQUE IOHEXOL 300 MG/ML  SOLN  COMPARISON:  None.  FINDINGS: Lung bases are clear.  Liver is enlarged measuring 20.6 cm in length. There is diffuse fatty change in the liver. No focal liver lesions are appreciated. There is no appreciable biliary duct dilatation. Gallbladder wall is not thickened.  Spleen, pancreas, and adrenals appear normal. Kidneys bilaterally show no mass or hydronephrosis on either side. There is no renal or ureteral calculus on  either side. There is a retroaortic left renal vein, an anatomic variant.  There is thickening of the subcutaneous tissue in the anterior mid to lower abdominal and pelvic wall regions. There is a fluid collection in the anterior left pelvic wall with surrounding inflammation measuring 4.9 x 3.2 cm. On the right in this same area, there is a smaller thick-walled fluid collection measuring 1.9 x 1.9 cm in the subcutaneous region. Both of these lesions are best seen on axial slice 74 series 201.  In the pelvis, the urinary bladder is midline with normal wall thickness. There is no pelvic mass or fluid collection. There are small inguinal lymph nodes which do not meet size criteria for pathologic significance. The appendix appears normal.  There is no bowel obstruction.  No free air or portal venous air.  There is no ascites or adenopathy in the abdomen or pelvis. There is no intraperitoneal or retroperitoneal abscess. There is no evidence of abdominal aortic aneurysm. There are no blastic or lytic bone lesions.  IMPRESSION: Anterior lower abdominal and pelvic wall cellulitis. At the level of the mid to lower pelvis, there is a phlegmon in the anterior left pelvic wall measuring 4.9 x 3.2 cm. At this same level on the right, there is a 1.9 x 1.9 cm phlegmon in the subcutaneous region of the anterior pelvic wall as well.  Liver is enlarged with fatty change.  Appendix appears normal. No bowel obstruction. No intraperitoneal or retroperitoneal abscess. No renal or  ureteral calculus. No hydronephrosis.   Electronically Signed   By: Bretta Bang M.D.   On: 10/26/2013 22:19    Chart has been reviewed  Assessment/Plan  28 yo with hx of DM and poor medication compliance due to financial issues here with cellulitis of the abdomen resulting in sepsis.   Present on Admission:  . Abdominal wall cellulitis - treat with IV vanc and Levaquin patient is pen allergic . Sepsis  - monitor vitals, improved after IV antibiotics have been initiated . Type II or unspecified type diabetes mellitus with unspecified complication, uncontrolled-  diabetic education and start on insulin due to hyperglycemia   Prophylaxis:   Lovenox,  CODE STATUS:  FULL CODE    Other plan as per orders.  I have spent a total of 55 min on this admission  Alfhild Partch 10/27/2013, 1:34 AM  [facility]  Pager [phone]   If 7AM-7PM, please contact the day team taking care of the patient  [email or web address]  Password [email or web address]

## 2013-10-27 NOTE — Anesthesia Postprocedure Evaluation (Signed)
Anesthesia Post Note  Patient: William Sanders  Procedure(s) Performed: Procedure(s) (LRB): I&D ABDOMINAL WALL ABSCESS (N/A)  Anesthesia type: general  Patient location: PACU  Post pain: Pain level controlled  Post assessment: Patient's Cardiovascular Status Stable  Last Vitals:  Filed Vitals:   10/27/13 1445  BP:   Pulse: 102  Temp:   Resp: 20    Post vital signs: Reviewed and stable  Level of consciousness: sedated  Complications: No apparent anesthesia complications

## 2013-10-27 NOTE — Progress Notes (Signed)
Pt arrived on unit, alert and oriented x4. Able to make needs known. In no distress, no SOB noted. Vital signs taken and stable.  LAC IV site, clean dry and intact. Skin warm and dry, redness noted on mid lower abd d/t cellulitis. Oriented to room and staff. Call light placed within reached. Pt care guide packet provided. Paged admitting doctor. We will continue to monitor.

## 2013-10-28 LAB — BASIC METABOLIC PANEL
ANION GAP: 14 (ref 5–15)
BUN: 9 mg/dL (ref 6–23)
CO2: 23 mEq/L (ref 19–32)
Calcium: 8.3 mg/dL — ABNORMAL LOW (ref 8.4–10.5)
Chloride: 103 mEq/L (ref 96–112)
Creatinine, Ser: 0.75 mg/dL (ref 0.50–1.35)
GFR calc non Af Amer: >90 mL/min (ref >90)
Glucose, Bld: 213 mg/dL — ABNORMAL HIGH (ref 70–99)
Potassium: 4.9 mEq/L (ref 3.7–5.3)
Sodium: 140 mEq/L (ref 137–147)

## 2013-10-28 LAB — CBC
HCT: 45.2 % (ref 39.0–52.0)
Hemoglobin: 15.4 g/dL (ref 13.0–17.0)
MCH: 28.2 pg (ref 26.0–34.0)
MCHC: 34.1 g/dL (ref 30.0–36.0)
MCV: 82.8 fL (ref 78.0–100.0)
PLATELETS: 171 10*3/uL (ref 150–400)
RBC: 5.46 MIL/uL (ref 4.22–5.81)
RDW: 13 % (ref 11.5–15.5)
WBC: 8.7 10*3/uL (ref 4.0–10.5)

## 2013-10-28 LAB — GLUCOSE, CAPILLARY
GLUCOSE-CAPILLARY: 182 mg/dL — AB (ref 70–99)
Glucose-Capillary: 201 mg/dL — ABNORMAL HIGH (ref 70–99)
Glucose-Capillary: 267 mg/dL — ABNORMAL HIGH (ref 70–99)
Glucose-Capillary: 276 mg/dL — ABNORMAL HIGH (ref 70–99)

## 2013-10-28 MED ORDER — INSULIN ASPART PROT & ASPART (70-30 MIX) 100 UNIT/ML ~~LOC~~ SUSP
18.0000 [IU] | Freq: Two times a day (BID) | SUBCUTANEOUS | Status: DC
  Administered 2013-10-28 – 2013-10-29 (×2): 18 [IU] via SUBCUTANEOUS
  Filled 2013-10-28: qty 10

## 2013-10-28 NOTE — Op Note (Signed)
[name], William Sanders                ACCOUNT NO.:  [id number]  MEDICAL RECORD NO.:  [id number]  LOCATION:  [id number]                        FACILITY:  [facility]  PHYSICIAN:  Abigail Miyamotoouglas Lonna Rabold, M.D. DATE OF BIRTH:  12/17/1985  DATE OF PROCEDURE:  10/27/2013 DATE OF DISCHARGE:                              OPERATIVE REPORT   PREOPERATIVE DIAGNOSIS:  Abdominal wall abscess.  POSTOPERATIVE DIAGNOSIS:  Abdominal wall abscess.  PROCEDURE:  Incision and drainage of abdominal wall abscess.  SURGEON:  Abigail Miyamotoouglas Lorijean Husser, MD  ANESTHESIA:  General and 0.25% Marcaine.  ESTIMATED BLOOD LOSS:  Minimal.  PROCEDURE IN DETAIL:  The patient was brought to the operating room, identified as William Sanders.  He was placed supine on the operating table and general anesthesia was induced.  His abdomen was then prepped and draped in usual sterile fashion.  He had a hard indurated area with some fluctuance in the left lower quadrant of the abdomen.  I made an incision over top of this with a scalpel and entered a large abscess cavity.  Cultures were obtained of the purulence.  I then probed this extensively and found that it did not extend anywhere else.  It was however a large abscess cavity.  I achieved hemostasis with the cautery. I anesthetized it thoroughly with Marcaine.  I then packed it with wet- to-dry saline gauze.  On the CAT scan, there was a potential another small fluid collection in the right lower quadrant.  I made an incision on the right lower quadrant with a scalpel and probed all around and found no abscess cavity there. I then packed this small wound after anesthetizing with Marcaine with Kerlix as well.  Dry gauze was then placed over both incisions.  The patient tolerated the procedure well. All the counts were correct at the end of procedure.  The patient was then extubated in the operating room and taken in stable condition to recovery room.     Abigail Miyamotoouglas Adelfo Diebel, M.D.     DB/MEDQ   D:  10/27/2013  T:  10/28/2013  Job:  [id number]

## 2013-10-28 NOTE — Progress Notes (Addendum)
Inpatient Diabetes Program Recommendations  AACE/ADA: New Consensus Statement on Inpatient Glycemic Control (2013)  Target Ranges:  Prepandial:   less than 140 mg/dL      Peak postprandial:   less than 180 mg/dL (1-2 hours)      Critically ill patients:  140 - 180 mg/dL    Results for William Sanders, William Sanders (MRN [id number]) as of 10/28/2013 09:25  Ref. Range 10/27/2013 07:57 10/27/2013 11:57 10/27/2013 14:48 10/27/2013 17:19 10/27/2013 21:38  Glucose-Capillary Latest Range: 70-99 mg/dL 782263 (H) 956255 (H) 213196 (H) 197 (H) 286 (H)    Results for William Sanders, William Sanders (MRN [id number]) as of 10/28/2013 09:25  Ref. Range 10/27/2013 08:52  Hemoglobin A1C Latest Range: <5.7 % 13.1 (H)      Admitted with Abdominal Wall Cellulitis. History of DM, HTN.   Home DM Meds: Metformin 850 mg bid    **Patient started on Lantus 10 units QHS at 1am yesterday.  Received another 10 units Lantus last PM at bedtime. Patient also receiving Novolog Resistant SSI.  **Note A1c shows extremely poor control at home.  Have asked RNs to begin insulin education with this patient with vial and syringe method.     MD- After speaking extensively with patient yesterday (see DM Coordinator note from 07/14), patient states he will not be able to afford expensive insulin or other medications after d/c. If decision made to send pt home on insulin, please consider Reli-on 70/30 insulin from [facility] (this insulin is $25 per vial). Patient very hesitant to start insulin but will have RNs begin insulin teaching in case decision made to send patient home on insulin.   MD- Please consider switching patient to 70/30 insulin for affordability for home.  Patient back on PO diet as of last PM.   Could start with 70/30 insulin 18 units bid with meals (breakfast and supper)- This dose would be equivalent to 0.2 units/kg dosing for long-acting insulin in total 70/30 dose  [facility]    Will  follow Ambrose FinlandJeannine Johnston Tonie Elsey RN, MSN, CDE Diabetes Coordinator Inpatient Diabetes Program Team Pager: [phone] (8a-10p)

## 2013-10-28 NOTE — Progress Notes (Signed)
Patient ID: William Sanders, male   DOB: July 27, 1985, 28 y.o.   MRN: [id number]  Subjective: WBC normal.  Afebrile.  VSS.  Little pain  Objective:  Vital signs:  Filed Vitals:   10/27/13 1440 10/27/13 1445 10/27/13 2054 10/28/13 0453  BP: 130/84  138/85 135/88  Pulse: 101 102 101 85  Temp: 99.6 F (37.6 C)  100.2 F (37.9 C) 98.9 F (37.2 C)  TempSrc:   Oral Oral  Resp: $Remo'28 20 28 20  'xvVzb$ Height:      Weight:      SpO2: 91% 93% 94% 92%    Last BM Date: 10/24/13  Intake/Output   Yesterday:  07/14 0701 - 07/15 0700 In: 1430 [P.O.:480; I.V.:300; IV Piggyback:650] Out: -  This shift:     Physical Exam: General: Pt awake/alert/oriented x4 in no  acute distress Abdomen: Soft.  Nondistended.  RLQ wound is clean without surrounding erythema or induration.  LLQ wound is clean without erythema, but some induration/edema around it, no residual pockets were appreciated on exam.     Problem List:   Active Problems:   Abdominal wall cellulitis   Sepsis   Type II or unspecified type diabetes mellitus with unspecified complication, uncontrolled    Results:   Labs: Results for orders placed during the hospital encounter of 10/26/13 (from the past 48 hour(s))  CBC WITH DIFFERENTIAL     Status: Abnormal   Collection Time    10/26/13  8:34 PM      Result Value Ref Range   WBC 9.0  4.0 - 10.5 K/uL   RBC 5.89 (*) 4.22 - 5.81 MIL/uL   Hemoglobin 16.9  13.0 - 17.0 g/dL   HCT 47.5  39.0 - 52.0 %   MCV 80.6  78.0 - 100.0 fL   MCH 28.7  26.0 - 34.0 pg   MCHC 35.6  30.0 - 36.0 g/dL   RDW 12.9  11.5 - 15.5 %   Platelets 183  150 - 400 K/uL   Neutrophils Relative % 59  43 - 77 %   Neutro Abs 5.3  1.7 - 7.7 K/uL   Lymphocytes Relative 30  12 - 46 %   Lymphs Abs 2.7  0.7 - 4.0 K/uL   Monocytes Relative 11  3 - 12 %   Monocytes Absolute 1.0  0.1 - 1.0 K/uL   Eosinophils Relative 0  0 - 5 %   Eosinophils Absolute 0.0  0.0 - 0.7 K/uL   Basophils Relative 0  0 - 1 %   Basophils Absolute  0.0  0.0 - 0.1 K/uL  COMPREHENSIVE METABOLIC PANEL     Status: Abnormal   Collection Time    10/26/13  8:34 PM      Result Value Ref Range   Sodium 134 (*) 137 - 147 mEq/L   Potassium 3.8  3.7 - 5.3 mEq/L   Chloride 94 (*) 96 - 112 mEq/L   CO2 21  19 - 32 mEq/L   Glucose, Bld 403 (*) 70 - 99 mg/dL   BUN 7  6 - 23 mg/dL   Creatinine, Ser 0.74  0.50 - 1.35 mg/dL   Calcium 9.1  8.4 - 10.5 mg/dL   Total Protein 8.1  6.0 - 8.3 g/dL   Albumin 4.0  3.5 - 5.2 g/dL   AST 46 (*) 0 - 37 U/L   ALT 123 (*) 0 - 53 U/L   Alkaline Phosphatase 68  39 - 117 U/L   Total Bilirubin 0.9  0.3 - 1.2 mg/dL   GFR calc non Af Amer >90  >90 mL/min   GFR calc Af Amer >90  >90 mL/min   Comment: (NOTE)     The eGFR has been calculated using the CKD EPI equation.     This calculation has not been validated in all clinical situations.     eGFR's persistently <90 mL/min signify possible Chronic Kidney     Disease.   Anion gap 19 (*) 5 - 15  CULTURE, BLOOD (ROUTINE X 2)     Status: None   Collection Time    10/26/13  8:50 PM      Result Value Ref Range   Specimen Description BLOOD RIGHT ARM     Special Requests BOTTLES DRAWN AEROBIC AND ANAEROBIC 10CC EACH     Culture  Setup Time       Value: 10/27/2013 00:46     Performed at [facility]   Culture       Value:        BLOOD CULTURE RECEIVED NO GROWTH TO DATE CULTURE WILL BE HELD FOR 5 DAYS BEFORE ISSUING A FINAL NEGATIVE REPORT     Performed at [facility]   Report Status PENDING    CULTURE, BLOOD (ROUTINE X 2)     Status: None   Collection Time    10/26/13  8:57 PM      Result Value Ref Range   Specimen Description BLOOD LEFT ARM     Special Requests BOTTLES DRAWN AEROBIC ONLY 5CC     Culture  Setup Time       Value: 10/27/2013 00:46     Performed at [facility]   Culture       Value:        BLOOD CULTURE RECEIVED NO GROWTH TO DATE CULTURE WILL BE HELD FOR 5 DAYS BEFORE ISSUING A FINAL NEGATIVE REPORT     Performed at  [facility]   Report Status PENDING    I-STAT CG4 LACTIC ACID, ED     Status: None   Collection Time    10/26/13  9:19 PM      Result Value Ref Range   Lactic Acid, Venous 2.14  0.5 - 2.2 mmol/L  CBG MONITORING, ED     Status: Abnormal   Collection Time    10/26/13 11:12 PM      Result Value Ref Range   Glucose-Capillary 255 (*) 70 - 99 mg/dL  MAGNESIUM     Status: None   Collection Time    10/27/13  5:35 AM      Result Value Ref Range   Magnesium 2.0  1.5 - 2.5 mg/dL  PHOSPHORUS     Status: None   Collection Time    10/27/13  5:35 AM      Result Value Ref Range   Phosphorus 2.5  2.3 - 4.6 mg/dL  TSH     Status: None   Collection Time    10/27/13  5:35 AM      Result Value Ref Range   TSH 2.250  0.350 - 4.500 uIU/mL  COMPREHENSIVE METABOLIC PANEL     Status: Abnormal   Collection Time    10/27/13  5:35 AM      Result Value Ref Range   Sodium 137  137 - 147 mEq/L   Potassium 3.8  3.7 - 5.3 mEq/L   Chloride 99  96 - 112 mEq/L   CO2 22  19 -  32 mEq/L   Glucose, Bld 279 (*) 70 - 99 mg/dL   BUN 7  6 - 23 mg/dL   Creatinine, Ser 0.71  0.50 - 1.35 mg/dL   Calcium 8.2 (*) 8.4 - 10.5 mg/dL   Total Protein 6.9  6.0 - 8.3 g/dL   Albumin 3.3 (*) 3.5 - 5.2 g/dL   AST 34  0 - 37 U/L   ALT 97 (*) 0 - 53 U/L   Alkaline Phosphatase 60  39 - 117 U/L   Total Bilirubin 1.2  0.3 - 1.2 mg/dL   GFR calc non Af Amer >90  >90 mL/min   GFR calc Af Amer >90  >90 mL/min   Comment: (NOTE)     The eGFR has been calculated using the CKD EPI equation.     This calculation has not been validated in all clinical situations.     eGFR's persistently <90 mL/min signify possible Chronic Kidney     Disease.   Anion gap 16 (*) 5 - 15  CBC     Status: None   Collection Time    10/27/13  5:35 AM      Result Value Ref Range   WBC 8.7  4.0 - 10.5 K/uL   RBC 5.55  4.22 - 5.81 MIL/uL   Hemoglobin 15.7  13.0 - 17.0 g/dL   HCT 45.3  39.0 - 52.0 %   MCV 81.6  78.0 - 100.0 fL   MCH 28.3  26.0 -  34.0 pg   MCHC 34.7  30.0 - 36.0 g/dL   RDW 12.9  11.5 - 15.5 %   Platelets 162  150 - 400 K/uL  GLUCOSE, CAPILLARY     Status: Abnormal   Collection Time    10/27/13  7:57 AM      Result Value Ref Range   Glucose-Capillary 263 (*) 70 - 99 mg/dL  CK TOTAL AND CKMB     Status: None   Collection Time    10/27/13  8:32 AM      Result Value Ref Range   Total CK 141  7 - 232 U/L   CK, MB 1.0  0.3 - 4.0 ng/mL   Relative Index 0.7  0.0 - 2.5  HEMOGLOBIN A1C     Status: Abnormal   Collection Time    10/27/13  8:52 AM      Result Value Ref Range   Hemoglobin A1C 13.1 (*) <5.7 %   Comment: (NOTE)                                                                               According to the ADA Clinical Practice Recommendations for 2011, when     HbA1c is used as a screening test:      >=6.5%   Diagnostic of Diabetes Mellitus               (if abnormal result is confirmed)     5.7-6.4%   Increased risk of developing Diabetes Mellitus     References:Diagnosis and Classification of Diabetes Mellitus,Diabetes     DXIP,3825,05(LZJQB 1):S62-S69 and Standards of Medical Care in  Diabetes - 2011,Diabetes Care,2011,34 (Suppl 1):S11-S61.   Mean Plasma Glucose 329 (*) <117 mg/dL   Comment: Performed at [facility]  HIV ANTIBODY (ROUTINE TESTING)     Status: None   Collection Time    10/27/13  8:52 AM      Result Value Ref Range   HIV 1&2 Ab, 4th Generation NONREACTIVE  NONREACTIVE   Comment: (NOTE)     A NONREACTIVE HIV Ag/Ab result does not exclude HIV infection since     the time frame for seroconversion is variable. If acute HIV infection     is suspected, a HIV-1 RNA Qualitative TMA test is recommended.     HIV-1/2 Antibody Diff         Not indicated.     HIV-1 RNA, Qual TMA           Not indicated.     PLEASE NOTE: This information has been disclosed to you from records     whose confidentiality may be protected by state law. If your state     requires such  protection, then the state law prohibits you from making     any further disclosure of the information without the specific written     consent of the person to whom it pertains, or as otherwise permitted     by law. A general authorization for the release of medical or other     information is NOT sufficient for this purpose.     The performance of this assay has not been clinically validated in     patients less than 23 years old.     Performed at [facility]  GLUCOSE, CAPILLARY     Status: Abnormal   Collection Time    10/27/13 11:57 AM      Result Value Ref Range   Glucose-Capillary 255 (*) 70 - 99 mg/dL  SURGICAL PCR SCREEN     Status: None   Collection Time    10/27/13 12:51 PM      Result Value Ref Range   MRSA, PCR NEGATIVE  NEGATIVE   Staphylococcus aureus NEGATIVE  NEGATIVE   Comment:            The Xpert SA Assay (FDA     approved for NASAL specimens     in patients over 45 years of age),     is one component of     a comprehensive surveillance     program.  Test performance has     been validated by [facility] for patients greater     than or equal to 18 year old.     It is not intended     to diagnose infection nor to     guide or monitor treatment.  CULTURE, ROUTINE-ABSCESS     Status: None   Collection Time    10/27/13  2:34 PM      Result Value Ref Range   Specimen Description ABSCESS ABDOMEN     Special Requests PATIENT ON FOLLOWING VANCO AND CLEOCIN ABD WALL     Gram Stain PENDING     Culture       Value: NO GROWTH 1 DAY     Performed at [facility]   Report Status PENDING    GLUCOSE, CAPILLARY     Status: Abnormal   Collection Time    10/27/13  2:48 PM      Result Value Ref Range   Glucose-Capillary  196 (*) 70 - 99 mg/dL   Comment 1 Notify RN    GLUCOSE, CAPILLARY     Status: Abnormal   Collection Time    10/27/13  5:19 PM      Result Value Ref Range   Glucose-Capillary 197 (*) 70 - 99 mg/dL  GLUCOSE, CAPILLARY     Status:  Abnormal   Collection Time    10/27/13  9:38 PM      Result Value Ref Range   Glucose-Capillary 286 (*) 70 - 99 mg/dL  CBC     Status: None   Collection Time    10/28/13  6:48 AM      Result Value Ref Range   WBC 8.7  4.0 - 10.5 K/uL   RBC 5.46  4.22 - 5.81 MIL/uL   Hemoglobin 15.4  13.0 - 17.0 g/dL   HCT 45.2  39.0 - 52.0 %   MCV 82.8  78.0 - 100.0 fL   MCH 28.2  26.0 - 34.0 pg   MCHC 34.1  30.0 - 36.0 g/dL   RDW 13.0  11.5 - 15.5 %   Platelets 171  150 - 400 K/uL  BASIC METABOLIC PANEL     Status: Abnormal   Collection Time    10/28/13  6:48 AM      Result Value Ref Range   Sodium 140  137 - 147 mEq/L   Potassium 4.9  3.7 - 5.3 mEq/L   Comment: NO VISIBLE HEMOLYSIS   Chloride 103  96 - 112 mEq/L   CO2 23  19 - 32 mEq/L   Glucose, Bld 213 (*) 70 - 99 mg/dL   BUN 9  6 - 23 mg/dL   Creatinine, Ser 0.75  0.50 - 1.35 mg/dL   Calcium 8.3 (*) 8.4 - 10.5 mg/dL   GFR calc non Af Amer >90  >90 mL/min   GFR calc Af Amer >90  >90 mL/min   Comment: (NOTE)     The eGFR has been calculated using the CKD EPI equation.     This calculation has not been validated in all clinical situations.     eGFR's persistently <90 mL/min signify possible Chronic Kidney     Disease.   Anion gap 14  5 - 15  GLUCOSE, CAPILLARY     Status: Abnormal   Collection Time    10/28/13  7:49 AM      Result Value Ref Range   Glucose-Capillary 201 (*) 70 - 99 mg/dL    Imaging / Studies: Ct Abdomen Pelvis W Contrast  10/26/2013   CLINICAL DATA:  Abdominal wall cellulitis  EXAM: CT ABDOMEN AND PELVIS WITH CONTRAST  TECHNIQUE: Multidetector CT imaging of the abdomen and pelvis was performed using the standard protocol following bolus administration of intravenous contrast.  CONTRAST:  120mL OMNIPAQUE IOHEXOL 300 MG/ML  SOLN  COMPARISON:  None.  FINDINGS: Lung bases are clear.  Liver is enlarged measuring 20.6 cm in length. There is diffuse fatty change in the liver. No focal liver lesions are appreciated. There is  no appreciable biliary duct dilatation. Gallbladder wall is not thickened.  Spleen, pancreas, and adrenals appear normal. Kidneys bilaterally show no mass or hydronephrosis on either side. There is no renal or ureteral calculus on either side. There is a retroaortic left renal vein, an anatomic variant.  There is thickening of the subcutaneous tissue in the anterior mid to lower abdominal and pelvic wall regions. There is a fluid collection in the anterior left pelvic  wall with surrounding inflammation measuring 4.9 x 3.2 cm. On the right in this same area, there is a smaller thick-walled fluid collection measuring 1.9 x 1.9 cm in the subcutaneous region. Both of these lesions are best seen on axial slice 74 series 671.  In the pelvis, the urinary bladder is midline with normal wall thickness. There is no pelvic mass or fluid collection. There are small inguinal lymph nodes which do not meet size criteria for pathologic significance. The appendix appears normal.  There is no bowel obstruction.  No free air or portal venous air.  There is no ascites or adenopathy in the abdomen or pelvis. There is no intraperitoneal or retroperitoneal abscess. There is no evidence of abdominal aortic aneurysm. There are no blastic or lytic bone lesions.  IMPRESSION: Anterior lower abdominal and pelvic wall cellulitis. At the level of the mid to lower pelvis, there is a phlegmon in the anterior left pelvic wall measuring 4.9 x 3.2 cm. At this same level on the right, there is a 1.9 x 1.9 cm phlegmon in the subcutaneous region of the anterior pelvic wall as well.  Liver is enlarged with fatty change.  Appendix appears normal. No bowel obstruction. No intraperitoneal or retroperitoneal abscess. No renal or ureteral calculus. No hydronephrosis.   Electronically Signed   By: Lowella Grip M.D.   On: 10/26/2013 22:19    Scheduled Meds: . docusate sodium  100 mg Oral BID  . enoxaparin (LOVENOX) injection  60 mg Subcutaneous Q24H   . insulin aspart  0-20 Units Subcutaneous TID WC  . insulin aspart  0-5 Units Subcutaneous QHS  . insulin glargine  10 Units Subcutaneous QHS  . levofloxacin (LEVAQUIN) IV  750 mg Intravenous QHS  . sodium chloride  1,000 mL Intravenous Once  . vancomycin  1,250 mg Intravenous Q8H   Continuous Infusions: . sodium chloride 125 mL/hr at 10/28/13 0617   PRN Meds:.acetaminophen, acetaminophen, acetaminophen, HYDROcodone-acetaminophen, morphine injection, morphine injection, ondansetron (ZOFRAN) IV, ondansetron   Antibiotics: Anti-infectives   Start     Dose/Rate Route Frequency Ordered Stop   10/27/13 0800  vancomycin (VANCOCIN) 1,250 mg in sodium chloride 0.9 % 250 mL IVPB     1,250 mg 166.7 mL/hr over 90 Minutes Intravenous Every 8 hours 10/27/13 0040     10/27/13 0100  vancomycin (VANCOCIN) 2,000 mg in sodium chloride 0.9 % 500 mL IVPB     2,000 mg 250 mL/hr over 120 Minutes Intravenous  Once 10/27/13 0040 10/27/13 0315   10/27/13 0100  levofloxacin (LEVAQUIN) IVPB 750 mg     750 mg 100 mL/hr over 90 Minutes Intravenous Daily at bedtime 10/27/13 0040     10/26/13 2115  clindamycin (CLEOCIN) IVPB 600 mg     600 mg 100 mL/hr over 30 Minutes Intravenous  Once 10/26/13 2108 10/26/13 2256      Assessment/Plan Abdominal wall abscess and cellulitis POD#1 I&D under anesthesia I changed his dressing this morning.  His wounds are clean.  He had some induration to the wound on the left, but cellulitis is resolved.  He may be discharged from surgical standpoint with PO antibiotics.  I suspect he may need to stay today for pain control.  He will need teaching on wound care and home health.     Erby Pian, [facility] Pager [phone] Office [phone]  10/28/2013 9:03 AM

## 2013-10-28 NOTE — Plan of Care (Signed)
Problem: Food- and Nutrition-Related Knowledge Deficit (NB-1.1) Goal: Nutrition education Formal process to instruct or train a patient/client in a skill or to impart knowledge to help patients/clients voluntarily manage or modify food choices and eating behavior to maintain or improve health. Outcome: Completed/Met Date Met:  10/28/13  RD consulted for nutrition education regarding diabetes.     Lab Results  Component Value Date    HGBA1C 13.1* 10/27/2013    RD provided "Carbohydrate Counting for People with Diabetes" handout from the Academy of [employer] Dietetics. Discussed different food groups and their effects on blood sugar, emphasizing carbohydrate-containing foods. Provided list of carbohydrates and recommended serving sizes of common foods.  Discussed importance of controlled and consistent carbohydrate intake throughout the day. Provided examples of ways to balance meals/snacks and encouraged intake of high-fiber, whole grain complex carbohydrates. Teach back method used.  Expect good compliance.  Body mass index is 39.27 kg/(m^2). Pt meets criteria for obesity based on current BMI.  Current diet order is carbohydrate modified, patient is consuming approximately 100% of meals at this time. Labs and medications reviewed. No further nutrition interventions warranted at this time. RD contact information provided. If additional nutrition issues arise, please re-consult RD.  Terrace Arabia RD, LDN

## 2013-10-28 NOTE — Progress Notes (Signed)
[facility] PROGRESS NOTE  William Sanders ZOX:[id number] DOB: 1985/06/17 DOA: 10/26/2013 PCP: Pcp Not In System  Assessment/Plan: 1-Abdominal wall Abscess/cellulitis - Continue with Levaquin and vancomycin - s/p I&D per CCS 7/14 -wound care -FU cultures  2-Diabetes; uncontrolled, hbaic 13 -Stop lantus, . Start Insulin 70/30, Insulin teaching -DC Home on Insulin 70/30, needs close PCP FU -[facility] -Diabetic coordinator consult  3-Sepsis -due to 1, resolved -Blood Cx negative thus far -cut down IVF  DVt proph: lovenox  Code Status: full code.  Family Communication: care discussed with patient.  Disposition Plan: Home likely tomorrow    Consultants:  Surgery.   Procedures: 7/14: PROCEDURE: Incision and drainage of abdominal wall abscess  Antibiotics:  Levaquin 7-14  Vancomycin 7-14  HPI/Subjective: Feeling better, anxious abt Insulin  Objective: Filed Vitals:   10/28/13 1415  BP: 148/84  Pulse: 91  Temp: 99.1 F (37.3 C)  Resp: 18   No intake or output data in the 24 hours ending 10/28/13 1629 Filed Weights   10/26/13 2029 10/27/13 0020  Weight: 130.636 kg (288 lb) 126.962 kg (279 lb 14.4 oz)    Exam:   General:  No distress.   Cardiovascular: S 1, S 2 RRR  Respiratory: CTA  Abdomen: Bs present, soft, redness lower quadrant, mild induration, and small open areas with gauze, dressing   Musculoskeletal: no edema.   Data Reviewed: Basic Metabolic Panel:  Recent Labs Lab 10/26/13 2034 10/27/13 0535 10/28/13 0648  NA 134* 137 140  K 3.8 3.8 4.9  CL 94* 99 103  CO2 21 22 23   GLUCOSE 403* 279* 213*  BUN 7 7 9   CREATININE 0.74 0.71 0.75  CALCIUM 9.1 8.2* 8.3*  MG  --  2.0  --   PHOS  --  2.5  --    Liver Function Tests:  Recent Labs Lab 10/26/13 2034 10/27/13 0535  AST 46* 34  ALT 123* 97*  ALKPHOS 68 60  BILITOT 0.9 1.2  PROT 8.1 6.9  ALBUMIN 4.0 3.3*   No results found for this basename: LIPASE, AMYLASE,  in the last 168  hours No results found for this basename: AMMONIA,  in the last 168 hours CBC:  Recent Labs Lab 10/26/13 2034 10/27/13 0535 10/28/13 0648  WBC 9.0 8.7 8.7  NEUTROABS 5.3  --   --   HGB 16.9 15.7 15.4  HCT 47.5 45.3 45.2  MCV 80.6 81.6 82.8  PLT 183 162 171   Cardiac Enzymes:  Recent Labs Lab 10/27/13 0832  CKTOTAL 141  CKMB 1.0   BNP (last 3 results) No results found for this basename: PROBNP,  in the last 8760 hours CBG:  Recent Labs Lab 10/27/13 1448 10/27/13 1719 10/27/13 2138 10/28/13 0749 10/28/13 1158  GLUCAP 196* 197* 286* 201* 267*    Recent Results (from the past 240 hour(s))  CULTURE, BLOOD (ROUTINE X 2)     Status: None   Collection Time    10/26/13  8:50 PM      Result Value Ref Range Status   Specimen Description BLOOD RIGHT ARM   Final   Special Requests BOTTLES DRAWN AEROBIC AND ANAEROBIC 10CC EACH   Final   Culture  Setup Time     Final   Value: 10/27/2013 00:46     Performed at [facility]   Culture     Final   Value:        BLOOD CULTURE RECEIVED NO GROWTH TO DATE CULTURE WILL BE HELD FOR 5  DAYS BEFORE ISSUING A FINAL NEGATIVE REPORT     [facility]formed at [facility]   Report Status PENDING   Incomplete  CULTURE, BLOOD (ROUTINE X 2)     Status: None   Collection Time    10/26/13  8:57 PM      Result Value Ref Range Status   Specimen Description BLOOD LEFT ARM   Final   Special Requests BOTTLES DRAWN AEROBIC ONLY 5CC   Final   Culture  Setup Time     Final   Value: 10/27/2013 00:46     Performed at [facility]   Culture     Final   Value:        BLOOD CULTURE RECEIVED NO GROWTH TO DATE CULTURE WILL BE HELD FOR 5 DAYS BEFORE ISSUING A FINAL NEGATIVE REPORT     Performed at [facility]   Report Status PENDING   Incomplete  SURGICAL PCR SCREEN     Status: None   Collection Time    10/27/13 12:51 PM      Result Value Ref Range Status   MRSA, PCR NEGATIVE  NEGATIVE Final   Staphylococcus aureus NEGATIVE   NEGATIVE Final   Comment:            The Xpert SA Assay (FDA     approved for NASAL specimens     in patients over 29 years of age),     is one component of     a comprehensive surveillance     program.  Test performance has     been validated by [facility] for patients greater     than or equal to 29 year old.     It is not intended     to diagnose infection nor to     guide or monitor treatment.  CULTURE, ROUTINE-ABSCESS     Status: None   Collection Time    10/27/13  2:34 PM      Result Value Ref Range Status   Specimen Description ABSCESS ABDOMEN   Final   Special Requests PATIENT ON FOLLOWING VANCO AND CLEOCIN ABD WALL   Final   Gram Stain     Final   Value: ABUNDANT WBC PRESENT,BOTH PMN AND MONONUCLEAR     NO SQUAMOUS EPITHELIAL CELLS SEEN     MODERATE GRAM POSITIVE COCCI     IN PAIRS     Performed at [facility]   Culture     Final   Value: NO GROWTH 1 DAY     Performed at [facility]   Report Status PENDING   Incomplete  ANAEROBIC CULTURE     Status: None   Collection Time    10/27/13  2:34 PM      Result Value Ref Range Status   Specimen Description ABSCESS ABDOMEN   Final   Special Requests PATIENT ON FOLLOWING VANCO AND CLEOCIN ABD WALL   Final   Gram Stain     Final   Value: ABUNDANT WBC PRESENT,BOTH PMN AND MONONUCLEAR     NO SQUAMOUS EPITHELIAL CELLS SEEN     MODERATE GRAM POSITIVE COCCI     IN PAIRS     Performed at [facility]   Culture     Final   Value: NO ANAEROBES ISOLATED; CULTURE IN PROGRESS FOR 5 DAYS     Performed at [facility]   Report Status PENDING   Incomplete  Studies: Ct Abdomen Pelvis W Contrast  10/26/2013   CLINICAL DATA:  Abdominal wall cellulitis  EXAM: CT ABDOMEN AND PELVIS WITH CONTRAST  TECHNIQUE: Multidetector CT imaging of the abdomen and pelvis was performed using the standard protocol following bolus administration of intravenous contrast.  CONTRAST:  OMNIPAQUE IOHEXOL 300  MG/ML  SOLN  COMPARISON:  None.  FINDINGS: Lung bases are clear.  Liver is enlarged measuring 20.6 cm in length. There is diffuse fatty change in the liver. No focal liver lesions are appreciated. There is no appreciable biliary duct dilatation. Gallbladder wall is not thickened.  Spleen, pancreas, and adrenals appear normal. Kidneys bilaterally show no mass or hydronephrosis on either side. There is no renal or ureteral calculus on either side. There is a retroaortic left renal vein, an anatomic variant.  There is thickening of the subcutaneous tissue in the anterior mid to lower abdominal and pelvic wall regions. There is a fluid collection in the anterior left pelvic wall with surrounding inflammation measuring 4.9 x 3.2 cm. On the right in this same area, there is a smaller thick-walled fluid collection measuring 1.9 x 1.9 cm in the subcutaneous region. Both of these lesions are best seen on axial slice 74 series 201.  In the pelvis, the urinary bladder is midline with normal wall thickness. There is no pelvic mass or fluid collection. There are small inguinal lymph nodes which do not meet size criteria for pathologic significance. The appendix appears normal.  There is no bowel obstruction.  No free air or portal venous air.  There is no ascites or adenopathy in the abdomen or pelvis. There is no intraperitoneal or retroperitoneal abscess. There is no evidence of abdominal aortic aneurysm. There are no blastic or lytic bone lesions.  IMPRESSION: Anterior lower abdominal and pelvic wall cellulitis. At the level of the mid to lower pelvis, there is a phlegmon in the anterior left pelvic wall measuring 4.9 x 3.2 cm. At this same level on the right, there is a 1.9 x 1.9 cm phlegmon in the subcutaneous region of the anterior pelvic wall as well.  Liver is enlarged with fatty change.  Appendix appears normal. No bowel obstruction. No intraperitoneal or retroperitoneal abscess. No renal or ureteral calculus. No  hydronephrosis.   Electronically Signed   By: Bretta Bang M.D.   On: 10/26/2013 22:19    Scheduled Meds: . docusate sodium  100 mg Oral BID  . enoxaparin (LOVENOX) injection  60 mg Subcutaneous Q24H  . insulin aspart  0-20 Units Subcutaneous TID WC  . insulin aspart  0-5 Units Subcutaneous QHS  . insulin aspart protamine- aspart  18 Units Subcutaneous BID WC  . levofloxacin (LEVAQUIN) IV  750 mg Intravenous QHS  . sodium chloride  1,000 mL Intravenous Once  . vancomycin  1,250 mg Intravenous Q8H   Continuous Infusions: . sodium chloride 125 mL/hr at 10/28/13 4098    Active Problems:   Abdominal wall cellulitis   Sepsis   Type II or unspecified type diabetes mellitus with unspecified complication, uncontrolled    Time spent: 25 minutes.     [facility]  [facility] Pager [phone]. If 7PM-7AM, please contact night-coverage at [email or web address], password [facility] 10/28/2013, 4:29 PM  LOS: 2 days

## 2013-10-28 NOTE — Progress Notes (Signed)
I have seen and examined the patient and agree with the assessment and plans.  Faolan Springfield A. Swade Shonka  MD, FACS  

## 2013-10-29 LAB — CBC
HCT: 44.9 % (ref 39.0–52.0)
HEMOGLOBIN: 15.8 g/dL (ref 13.0–17.0)
MCH: 28.4 pg (ref 26.0–34.0)
MCHC: 35.2 g/dL (ref 30.0–36.0)
MCV: 80.8 fL (ref 78.0–100.0)
Platelets: 198 10*3/uL (ref 150–400)
RBC: 5.56 MIL/uL (ref 4.22–5.81)
RDW: 12.8 % (ref 11.5–15.5)
WBC: 7.1 10*3/uL (ref 4.0–10.5)

## 2013-10-29 LAB — GLUCOSE, CAPILLARY
GLUCOSE-CAPILLARY: 223 mg/dL — AB (ref 70–99)
Glucose-Capillary: 188 mg/dL — ABNORMAL HIGH (ref 70–99)

## 2013-10-29 MED ORDER — HYDROCODONE-ACETAMINOPHEN 5-300 MG PO TABS: 1.0000 | ORAL_TABLET | Freq: Four times a day (QID) | ORAL | Status: DC | PRN

## 2013-10-29 MED ORDER — INSULIN ASPART PROT & ASPART (70-30 MIX) 100 UNIT/ML ~~LOC~~ SUSP: 20.0000 [IU] | Freq: Two times a day (BID) | SUBCUTANEOUS | Status: AC

## 2013-10-29 MED ORDER — GLUCOSE BLOOD VI STRP: ORAL_STRIP | Status: AC

## 2013-10-29 MED ORDER — DOXYCYCLINE HYCLATE 100 MG PO TABS: 100.0000 mg | ORAL_TABLET | Freq: Two times a day (BID) | ORAL | Status: DC

## 2013-10-29 MED ORDER — LEVOFLOXACIN 500 MG PO TABS: 500.0000 mg | ORAL_TABLET | Freq: Every day | ORAL | Status: DC

## 2013-10-29 MED ORDER — INSULIN PEN NEEDLE 32G X 4 MM MISC: Status: AC

## 2013-10-29 MED ORDER — UNABLE TO FIND: Status: AC

## 2013-10-29 NOTE — Care Management Note (Signed)
    Page 1 of 1   10/29/2013     3:53:25 PM CARE MANAGEMENT NOTE 10/29/2013  Patient:  William Sanders,William Sanders   Account Number:  [id number]  Date Initiated:  10/29/2013  Documentation initiated by:  Letha CapeAYLOR,Norm Wray  Subjective/Objective Assessment:   dx abd wall abscess, new dm  admit- lives with mom, pta indep.     Action/Plan:   Anticipated DC Date:  10/29/2013   Anticipated DC Plan:  HOME W HOME HEALTH SERVICES      DC Planning Services  CM consult      [facility]   Choice offered to / List presented to:  C-1 Patient        HH arranged  HH-1 RN      [facility]  [facility]   Status of service:  Completed, signed off Medicare Important Message given?   (If response is "NO", the following Medicare IM given date fields will be blank) Date Medicare IM given:   Medicare IM given by:   Date Additional Medicare IM given:   Additional Medicare IM given by:    Discharge Disposition:  HOME W HOME HEALTH SERVICES  Per UR Regulation:  Reviewed for med. necessity/level of care/duration of stay  If discussed at Long Length of Stay Meetings, dates discussed:    Comments:  10/29/13 1551 Letha Capeeborah Johndaniel Catlin RN, BSN [phone] patient lives with mom, pta indep.  Patient states need ast with medications, NCM assisted with Match Program.  Patient has a f/[facility] apt [facility] [address] on 7/17.  Patient with no insurance states [facility].  [facility], Lupita LeashDonna notified. Soc will begin 24-48 hrs post dc.

## 2013-10-29 NOTE — Progress Notes (Signed)
Nsg Discharge Note  Admit Date:  10/26/2013 Discharge date: 10/29/2013   William Sanders to be D/C'd Home per MD order.  AVS completed.  Copy for chart, and copy for patient signed, and dated. Patient/caregiver able to verbalize understanding.  Discharge Medication:   Medication List         doxycycline 100 MG tablet  Commonly known as:  VIBRA-TABS  Take 1 tablet (100 mg total) by mouth 2 (two) times daily. For 7 days     glucose blood test strip  Use as instructed     hydrochlorothiazide 25 MG tablet  Commonly known as:  HYDRODIURIL  Take 25 mg by mouth daily.     Hydrocodone-Acetaminophen 5-300 MG Tabs  Commonly known as:  VICODIN  Take 1 tablet by mouth every 6 (six) hours as needed.     insulin aspart protamine- aspart (70-30) 100 UNIT/ML injection  Commonly known as:  NOVOLOG MIX 70/30  Inject 0.2 mLs (20 Units total) into the skin 2 (two) times daily with a meal.     Insulin Pen Needle 32G X 4 MM Misc  Commonly known as:  RELION PEN NEEDLES  USe BID for Insulin administration     levofloxacin 500 MG tablet  Commonly known as:  LEVAQUIN  Take 1 tablet (500 mg total) by mouth daily. For 5 days     metFORMIN 850 MG tablet  Commonly known as:  GLUCOPHAGE  Take 850 mg by mouth 2 (two) times daily with a meal.     UNABLE TO FIND  This note is to excuse [name] from work from 7/14 till 7/21 due to medical illness requiring hospitalizations        Discharge Assessment: Filed Vitals:   10/29/13 0359  BP: 118/75  Pulse: 77  Temp: 98.5 F (36.9 C)  Resp: 18   Skin clean, dry and intact without evidence of skin break down, no evidence of skin tears noted. IV catheter discontinued intact. Site without signs and symptoms of complications - no redness or edema noted at insertion site, patient denies c/o pain - only slight tenderness at site.  Dressing with slight pressure applied.  D/c Instructions-Education: Discharge instructions given to patient/family with  verbalized understanding. D/c education completed with patient/family including follow up instructions, medication list, d/c activities limitations if indicated, with other d/c instructions as indicated by MD - patient able to verbalize understanding, all questions fully answered. Patient instructed to return to ED, call 911, or call MD for any changes in condition.  Patient escorted via WC, and D/C home via private auto.  William Sanders, William Mccorry L, RN 10/29/2013 1:33 PM

## 2013-10-29 NOTE — Progress Notes (Signed)
I have seen and examined the patient and agree with the assessment and plans.  Ariza Evans A. Tonae Livolsi  MD, FACS  

## 2013-10-29 NOTE — Progress Notes (Signed)
2 Days Post-Op  Subjective: Sites look good, but are very tender.  He is going home today. We discussed wet to dry dressings, he can shower and repack after showers.  Objective: Vital signs in last 24 hours: Temp:  [98.5 F (36.9 C)-100 F (37.8 C)] 98.5 F (36.9 C) (07/16 0359) Pulse Rate:  [77-91] 77 (07/16 0359) Resp:  [18] 18 (07/16 0359) BP: (118-148)/(75-89) 118/75 mmHg (07/16 0359) SpO2:  [95 %-97 %] 97 % (07/16 0359) Last BM Date: 10/29/13 PO intake not listed Low grade temp qoo range last PM, better now, none since 3 AM recorded WBC improved Intake/Output from previous day: 07/15 0701 - 07/16 0700 In: 2600 [I.V.:2600] Out: -  Intake/Output this shift: Total I/O In: 1550 [IV Piggyback:1550] Out: -   General appearance: alert, cooperative and no distress Incision/Wound:  Open wound is clean.  Lab Results:   Recent Labs  10/28/13 0648 10/29/13 0721  WBC 8.7 7.1  HGB 15.4 15.8  HCT 45.2 44.9  PLT 171 198    BMET  Recent Labs  10/27/13 0535 10/28/13 0648  NA 137 140  K 3.8 4.9  CL 99 103  CO2 22 23  GLUCOSE 279* 213*  BUN 7 9  CREATININE 0.71 0.75  CALCIUM 8.2* 8.3*   PT/INR No results found for this basename: LABPROT, INR,  in the last 72 hours   Recent Labs Lab 10/26/13 2034 10/27/13 0535  AST 46* 34  ALT 123* 97*  ALKPHOS 68 60  BILITOT 0.9 1.2  PROT 8.1 6.9  ALBUMIN 4.0 3.3*     Lipase  No results found for this basename: lipase     Studies/Results: No results found.  Medications: . docusate sodium  100 mg Oral BID  . enoxaparin (LOVENOX) injection  60 mg Subcutaneous Q24H  . insulin aspart  0-20 Units Subcutaneous TID WC  . insulin aspart  0-5 Units Subcutaneous QHS  . insulin aspart protamine- aspart  18 Units Subcutaneous BID WC  . levofloxacin (LEVAQUIN) IV  750 mg Intravenous QHS  . sodium chloride  1,000 mL Intravenous Once  . vancomycin  1,250 mg Intravenous Q8H    Assessment/Plan Abdominal wall abscess LLQ  under the panus, Incision and drainage of abdominal wall abscess. 10/28/2013, Shelly Rubensteinouglas A [name], MD. Sepsis AODM Hypertension Body mass index is 39.27 kg/(m^2).  Plan:  He can go home from our standpoint. The culture is not back.  Wet to dry dressing.  Follow up in our office.    LOS: 3 days    Stuart Guillen 10/29/2013

## 2013-10-30 ENCOUNTER — Encounter (HOSPITAL_COMMUNITY): Payer: Self-pay | Admitting: Surgery

## 2013-10-30 LAB — CULTURE, ROUTINE-ABSCESS

## 2013-11-01 LAB — ANAEROBIC CULTURE

## 2013-11-02 LAB — CULTURE, BLOOD (ROUTINE X 2)
CULTURE: NO GROWTH
Culture: NO GROWTH

## 2013-11-09 ENCOUNTER — Telehealth (INDEPENDENT_AMBULATORY_CARE_PROVIDER_SITE_OTHER): Payer: Self-pay | Admitting: *Deleted

## 2013-11-09 NOTE — Telephone Encounter (Signed)
Mother called in this morning to make a 2 week p/o appt with Dr. Magnus IvanBlackman.  I didn't see anything available.  I advised mother that I would send Dr. Eliberto IvoryBlackman's assistant a message and have her find pt an appt and someone would call her back with it.  She verbalized understanding.  William DikeJennifer

## 2013-11-12 ENCOUNTER — Telehealth (INDEPENDENT_AMBULATORY_CARE_PROVIDER_SITE_OTHER): Payer: Self-pay

## 2013-11-12 NOTE — Telephone Encounter (Signed)
[facility] stating pt has 3 knot areas to right of wd that she is concerned may be more abscess forming. Pt temp 99.1 yesterday and 99.3 today. Pt offered appt for tomorrow to have Dr Magnus IvanBlackman ck areas. Pt declined due to insurance issues. I advised nurse that pt needs to be seen even if pt does have insurance issues. She states pt is aware and wants to watch area and call to come in sooner than appt that is already scheduled.Pt wants to come only if area increases in size or fever increases. I advised nurse to try to encourage pt to come in this week and call back if pt wants to make appt.

## 2013-11-17 ENCOUNTER — Encounter (INDEPENDENT_AMBULATORY_CARE_PROVIDER_SITE_OTHER): Payer: Self-pay | Admitting: Surgery

## 2013-11-17 ENCOUNTER — Ambulatory Visit (INDEPENDENT_AMBULATORY_CARE_PROVIDER_SITE_OTHER): Payer: BC Managed Care – PPO | Admitting: Surgery

## 2013-11-17 VITALS — BP 126/80 | HR 93 | Temp 98.2°F | Ht 69.0 in | Wt 275.0 lb

## 2013-11-17 DIAGNOSIS — Z09 Encounter for follow-up examination after completed treatment for conditions other than malignant neoplasm: Secondary | ICD-10-CM

## 2013-11-17 NOTE — Progress Notes (Signed)
Subjective:     Patient ID: William PorchLance Lofquist, male   DOB: 30-May-1985, 28 y.o.   MRN: [id number]  HPI He is here for his first postop visit status post incision and drainage of abdominal wall abscesses. He is undergoing wet-to-dry dressing changes and is doing well. He has no complaints. He has already been working light duty  Review of Systems     Objective:   Physical Exam On exam, there is a tiny wound on the right lower quadrant and a slightly larger wound on the left lower quadrant of his abdominal wall. There is no erythema or induration both are healing much better than expected.  I treated the open areas with silver nitrate.    Assessment:     Healing abdominal wall abscesses     Plan:     He can do just a dry bandage to the small wound on his right lower quadrant also the wet to dry swallow today on the left lower quadrant until it is healed. He'll return to normal activity at work starting August 10. I will see him back here as needed

## 2013-11-24 NOTE — Discharge Summary (Signed)
Physician Discharge Summary  [name]:[id number] DOB: [birth year] DOA: 10/26/2013  PCP: Pcp Not In System  Admit date: 10/26/2013 Discharge date: 10/29/2013  Time spent: 35 minutes  Recommendations for Outpatient Follow-up:  1. PCP at [facility] on 7/17 2. Dr.[name] [facility] in 2 weeks  Discharge Diagnoses:  Active Problems:   Abdominal wall cellulitis   Sepsis   Type II or unspecified type diabetes mellitus with unspecified complication, uncontrolled   Discharge Condition: stable  Diet recommendation: diabetic  Filed Weights   10/26/13 2029 10/27/13 0020  Weight: 130.636 kg (288 lb) 126.962 kg (279 lb 14.4 oz)    History of present illness:  Chief Complaint: Abdominal swelling  HPI: William Sanders is a 28 y.o. male  has a past medical history of Diabetes mellitus and Hypertension.  Presented with Patient have 2 days hx of swelling and redness of the lower abdomen that has been getting worse. He denies any fever but had some chills. IN ER he was found to have a fever up to 101.6. Patient has not been able to take his medications due to financial problems. CT showed two large phlegmons and cellulitis.    Hospital Course:  1-Abdominal wall Abscess/cellulitis  - Treated  with IV Levaquin and vancomycin  - s/p I&D per [facility] 7/14 , clinically improved -wound care taught to pt -FU cultures polymicrobial, discharge home on a course of levaquin and DOxycycline  2-Diabetes; uncontrolled, hbaic 13  -Started Insulin 70/30, Insulin teaching completed -DC Home on Insulin 70/30, needs close PCP FU  -Diabetic coordinator consult and education appreciated  3-Sepsis  -due to 1, resolved  -Blood Cx negative thus far     Procedures:  I&D of abd wall abscess  Consultations:  CCS  Discharge Exam: Filed Vitals:   10/29/13 0359  BP: 118/75  Pulse: 77  Temp: 98.5 F (36.9 C)  Resp: 18    General: AAOx3 Cardiovascular: S1S2/RRR Respiratory: CTAB  Discharge  Instructions You were cared for by a hospitalist during your hospital stay. If you have any questions about your discharge medications or the care you received while you were in the hospital after you are discharged, you can call the unit and asked to speak with the hospitalist on call if the hospitalist that took care of you is not available. Once you are discharged, your primary care physician will handle any further medical issues. Please note that NO REFILLS for any discharge medications will be authorized once you are discharged, as it is imperative that you return to your primary care physician (or establish a relationship with a primary care physician if you do not have one) for your aftercare needs so that they can reassess your need for medications and monitor your lab values.  Discharge Instructions   Diet Carb Modified    Complete by:  As directed      Increase activity slowly    Complete by:  As directed             Medication List         glucose blood test strip  Use as instructed     hydrochlorothiazide 25 MG tablet  Commonly known as:  HYDRODIURIL  Take 25 mg by mouth daily.     insulin aspart protamine- aspart (70-30) 100 UNIT/ML injection  Commonly known as:  NOVOLOG MIX 70/30  Inject 0.2 mLs (20 Units total) into the skin 2 (two) times daily with a meal.     Insulin Pen Needle 32G  X 4 MM Misc  Commonly known as:  RELION PEN NEEDLES  USe BID for Insulin administration     metFORMIN 850 MG tablet  Commonly known as:  GLUCOPHAGE  Take 850 mg by mouth 2 (two) times daily with a meal.     UNABLE TO FIND  This note is to excuse Mr.Ashbaugh from work from 7/14 till 7/21 due to medical illness requiring hospitalizations       Allergies  Allergen Reactions  . Amoxicillin Rash       Follow-up Information   Follow up with [facility] On 10/30/2013. (10:30 am)    Contact information:   [address] [place] [address] [phone]        [facility]. Schedule an appointment as soon as possible for a visit in 2 weeks.   Specialty:  General Surgery   Contact information:   [address] [place] [address] [phone]       Follow up with [facility])    Contact information:   [address] [place] [address] [phone]        The results of significant diagnostics from this hospitalization (including imaging, microbiology, ancillary and laboratory) are listed below for reference.    Significant Diagnostic Studies: Ct Abdomen Pelvis W Contrast  10/26/2013   CLINICAL DATA:  Abdominal wall cellulitis  EXAM: CT ABDOMEN AND PELVIS WITH CONTRAST  TECHNIQUE: Multidetector CT imaging of the abdomen and pelvis was performed using the standard protocol following bolus administration of intravenous contrast.  CONTRAST:  100mL OMNIPAQUE IOHEXOL 300 MG/ML  SOLN  COMPARISON:  None.  FINDINGS: Lung bases are clear.  Liver is enlarged measuring 20.6 cm in length. There is diffuse fatty change in the liver. No focal liver lesions are appreciated. There is no appreciable biliary duct dilatation. Gallbladder wall is not thickened.  Spleen, pancreas, and adrenals appear normal. Kidneys bilaterally show no mass or hydronephrosis on either side. There is no renal or ureteral calculus on either side. There is a retroaortic left renal vein, an anatomic variant.  There is thickening of the subcutaneous tissue in the anterior mid to lower abdominal and pelvic wall regions. There is a fluid collection in the anterior left pelvic wall with surrounding inflammation measuring 4.9 x 3.2 cm. On the right in this same area, there is a smaller thick-walled fluid collection measuring 1.9 x 1.9 cm in the subcutaneous region. Both of these lesions are best seen on axial slice 74 series 201.  In the pelvis, the urinary bladder is midline with normal wall thickness. There is no pelvic  mass or fluid collection. There are small inguinal lymph nodes which do not meet size criteria for pathologic significance. The appendix appears normal.  There is no bowel obstruction.  No free air or portal venous air.  There is no ascites or adenopathy in the abdomen or pelvis. There is no intraperitoneal or retroperitoneal abscess. There is no evidence of abdominal aortic aneurysm. There are no blastic or lytic bone lesions.  IMPRESSION: Anterior lower abdominal and pelvic wall cellulitis. At the level of the mid to lower pelvis, there is a phlegmon in the anterior left pelvic wall measuring 4.9 x 3.2 cm. At this same level on the right, there is a 1.9 x 1.9 cm phlegmon in the subcutaneous region of the anterior pelvic wall as well.  Liver is enlarged with fatty change.  Appendix  appears normal. No bowel obstruction. No intraperitoneal or retroperitoneal abscess. No renal or ureteral calculus. No hydronephrosis.   Electronically Signed   By: Bretta Bang M.D.   On: 10/26/2013 22:19    Microbiology: No results found for this or any previous visit (from the past 240 hour(s)).   Labs: Basic Metabolic Panel: No results found for this basename: NA, K, CL, CO2, GLUCOSE, BUN, CREATININE, CALCIUM, MG, PHOS,  in the last 168 hours Liver Function Tests: No results found for this basename: AST, ALT, ALKPHOS, BILITOT, PROT, ALBUMIN,  in the last 168 hours No results found for this basename: LIPASE, AMYLASE,  in the last 168 hours No results found for this basename: AMMONIA,  in the last 168 hours CBC: No results found for this basename: WBC, NEUTROABS, HGB, HCT, MCV, PLT,  in the last 168 hours Cardiac Enzymes: No results found for this basename: CKTOTAL, CKMB, CKMBINDEX, TROPONINI,  in the last 168 hours BNP: BNP (last 3 results) No results found for this basename: PROBNP,  in the last 8760 hours CBG: No results found for this basename: GLUCAP,  in the last 168  hours     Signed:  Fiore Detjen  [facility] 11/24/2013, 4:37 PM

## 2014-09-23 ENCOUNTER — Encounter (HOSPITAL_COMMUNITY): Payer: Self-pay | Admitting: Emergency Medicine

## 2014-09-23 ENCOUNTER — Emergency Department (HOSPITAL_COMMUNITY)
Admission: EM | Admit: 2014-09-23 | Discharge: 2014-09-23 | Disposition: A | Discharge status: Home / Self Care | Payer: 59 | Attending: Emergency Medicine | Admitting: Emergency Medicine

## 2014-09-23 DIAGNOSIS — Z880 Allergy status to penicillin: Secondary | ICD-10-CM | POA: Insufficient documentation

## 2014-09-23 DIAGNOSIS — I10 Essential (primary) hypertension: Secondary | ICD-10-CM | POA: Insufficient documentation

## 2014-09-23 DIAGNOSIS — Z794 Long term (current) use of insulin: Secondary | ICD-10-CM | POA: Insufficient documentation

## 2014-09-23 DIAGNOSIS — Z79899 Other long term (current) drug therapy: Secondary | ICD-10-CM | POA: Insufficient documentation

## 2014-09-23 DIAGNOSIS — L0291 Cutaneous abscess, unspecified: Secondary | ICD-10-CM

## 2014-09-23 DIAGNOSIS — L02211 Cutaneous abscess of abdominal wall: Secondary | ICD-10-CM | POA: Insufficient documentation

## 2014-09-23 DIAGNOSIS — E119 Type 2 diabetes mellitus without complications: Secondary | ICD-10-CM | POA: Diagnosis not present

## 2014-09-23 MED ORDER — LIDOCAINE-EPINEPHRINE 1 %-1:100000 IJ SOLN: 20.0000 mL | Freq: Once | INTRAMUSCULAR | Status: DC

## 2014-09-23 MED ORDER — LIDOCAINE-EPINEPHRINE (PF) 2 %-1:200000 IJ SOLN
20.0000 mL | Freq: Once | INTRAMUSCULAR | Status: AC
  Administered 2014-09-23: 20 mL

## 2014-09-23 MED ORDER — SULFAMETHOXAZOLE-TRIMETHOPRIM 800-160 MG PO TABS: 1.0000 | ORAL_TABLET | Freq: Two times a day (BID) | ORAL | Status: AC

## 2014-09-23 NOTE — ED Provider Notes (Addendum)
CSN: [id number]     Arrival date & time 09/23/14  0053 History   First MD Initiated Contact with Patient 09/23/14 0135     This chart was scribed for Mirian Mo, MD by Arlan Organ, ED Scribe. This patient was seen in room B17C/B17C and the patient's care was started 1:50 AM.   Chief Complaint  Patient presents with  . Abscess   The history is provided by the patient. No language interpreter was used.    HPI Comments: William Sanders is a 29 y.o. male with a PMHx of HTN and DM who presents to the Emergency Department here for an abscess to the R lower abdomen x 2 days. Area is worsened with palpation and certain positions. No alleviating factors at this time. Pt also reports an intermittent low grade fever, however no measured temp >100.4. No OTC medications or home remedies attempted prior to arrival. No nasuea, vomiting, or chest pain, or SOB at this time. Pt admits abscesses tend to be recurrent to the abdomen. Pt with known allergies to Amoxicillin.  Past Medical History  Diagnosis Date  . Diabetes mellitus without complication   . Hypertension    Past Surgical History  Procedure Laterality Date  . Debridement of abdominal wall abscess N/A 10/27/2013    Procedure: I&D ABDOMINAL WALL ABSCESS;  Surgeon: Shelly Rubenstein, MD;  Location: [facility] OR;  Service: General;  Laterality: N/A;   Family History  Problem Relation Age of Onset  . HIV Mother   . CAD Father    History  Substance Use Topics  . Smoking status: Never Smoker   . Smokeless tobacco: Not on file  . Alcohol Use: No    Review of Systems  Constitutional: Positive for fever. Negative for chills.  Respiratory: Negative for shortness of breath.   Cardiovascular: Negative for chest pain.  Gastrointestinal: Negative for nausea and vomiting.  Skin: Positive for wound.  All other systems reviewed and are negative.     Allergies  Amoxicillin  Home Medications   Prior to Admission medications   Medication Sig Start  Date End Date Taking? Authorizing Provider  glucose blood test strip Use as instructed 10/29/13   Zannie Cove, MD  hydrochlorothiazide (HYDRODIURIL) 25 MG tablet Take 25 mg by mouth daily.    Historical Provider, MD  insulin aspart protamine- aspart (NOVOLOG MIX 70/30) (70-30) 100 UNIT/ML injection Inject 0.2 mLs (20 Units total) into the skin 2 (two) times daily with a meal. 10/29/13   Zannie Cove, MD  Insulin Pen Needle (RELION PEN NEEDLES) 32G X 4 MM MISC USe BID for Insulin administration 10/29/13   Zannie Cove, MD  metFORMIN (GLUCOPHAGE) 850 MG tablet Take 850 mg by mouth 2 (two) times daily with a meal.    Historical Provider, MD  sulfamethoxazole-trimethoprim (BACTRIM DS,SEPTRA DS) 800-160 MG per tablet Take 1 tablet by mouth 2 (two) times daily. 09/23/14   Mirian Mo, MD  UNABLE TO FIND This note is to excuse Mr.Mothershead from work from 7/14 till 7/21 due to medical illness requiring hospitalizations 10/29/13   Zannie Cove, MD   Triage Vitals: BP 124/78 mmHg  Pulse 98  Temp(Src) 98.6 F (37 C) (Oral)  Resp 16  SpO2 100%   Physical Exam  Constitutional: He is oriented to person, place, and time. He appears well-developed and well-nourished.  HENT:  Head: Normocephalic and atraumatic.  Eyes: Conjunctivae and EOM are normal.  Neck: Normal range of motion. Neck supple.  Cardiovascular: Normal rate, regular rhythm and  normal heart sounds.   Pulmonary/Chest: Effort normal and breath sounds normal. No respiratory distress.  Abdominal: He exhibits no distension. There is tenderness (over abscess site at R mons, 6 cm area of fluctuance). There is no rebound and no guarding.  Musculoskeletal: Normal range of motion.  Neurological: He is alert and oriented to person, place, and time.  Skin: Skin is warm and dry.  Vitals reviewed.   ED Course  Procedures (including critical care time)  DIAGNOSTIC STUDIES: Oxygen Saturation is 100% on RA, Normal by my interpretation.     COORDINATION OF CARE: 1:50 AM- Will perform incision and drainage. Discussed treatment plan with pt at bedside and pt agreed to plan.     INCISION AND DRAINAGE PROCEDURE NOTE: Patient identification was confirmed and verbal consent was obtained. This procedure was performed by Mirian Mo, MD at 2:07 AM. Site: R lower abdomen Sterile procedures observed Anesthetic used (type and amt): 1% lidocaine with epinephrine Drainage: Copious  Complexity: Complex Packing used: Iodoform  Site anesthetized, incision made over site, wound drained and explored loculations, rinsed with copious amounts of normal saline, wound packed with sterile gauze, covered with dry, sterile dressing.  Pt tolerated procedure well without complications.  Instructions for care discussed verbally and pt provided with additional written instructions for homecare and f/u.   Labs Review Labs Reviewed  CULTURE, ROUTINE-ABSCESS    Imaging Review No results found.   EKG Interpretation None       EMERGENCY DEPARTMENT US SOFT TISSUE INTERPRETATION "Study: Limited Soft Tissue Ultrasound"  INDICATIONS: Soft tissue infection Multiple views of the body part were obtained in real-time with a multi-frequency linear probe PERFORMED BY:  Myself IMAGES ARCHIVED?: Yes SIDE:Right  BODY PART:Abdominal wall FINDINGS: Abcess present INTERPRETATION:  Abcess present with cellulitis   CPT:   Abdominal wall 17616-07   MDM   Final diagnoses:  Abscess    29 y.o. male with pertinent PMH of prior L sided abd wall, DM, HTN presents with R sided abd wall abscess.  Exam as above.  Evaluated with [place], could picture entire abscess, not involving fascia or abd, with clear stop.  Drained as above.  Pt well appearing.  DC home with bactrim and fu for wound check.    I have reviewed all laboratory and imaging studies if ordered as above  1. Abscess           Mirian Mo, MD 09/23/14 3710  Mirian Mo,  MD 09/23/14 [phone]

## 2014-09-23 NOTE — Discharge Instructions (Signed)

## 2014-09-23 NOTE — ED Notes (Signed)
Patient with boil on abdomen.  Patient states that it started on Monday.  Area is red, warm and tender to the touch.

## 2014-09-26 LAB — CULTURE, ROUTINE-ABSCESS: Special Requests: NORMAL

## 2020-02-15 ENCOUNTER — Other Ambulatory Visit: Payer: Self-pay

## 2020-02-15 ENCOUNTER — Emergency Department (HOSPITAL_BASED_OUTPATIENT_CLINIC_OR_DEPARTMENT_OTHER): Payer: BC Managed Care – PPO

## 2020-02-15 ENCOUNTER — Encounter (HOSPITAL_BASED_OUTPATIENT_CLINIC_OR_DEPARTMENT_OTHER): Payer: Self-pay | Admitting: *Deleted

## 2020-02-15 ENCOUNTER — Emergency Department (HOSPITAL_BASED_OUTPATIENT_CLINIC_OR_DEPARTMENT_OTHER)
Admission: EM | Admit: 2020-02-15 | Discharge: 2020-02-15 | Disposition: A | Discharge status: Home / Self Care | Payer: BC Managed Care – PPO | Attending: Emergency Medicine | Admitting: Emergency Medicine

## 2020-02-15 DIAGNOSIS — R21 Rash and other nonspecific skin eruption: Secondary | ICD-10-CM | POA: Diagnosis not present

## 2020-02-15 DIAGNOSIS — R739 Hyperglycemia, unspecified: Secondary | ICD-10-CM

## 2020-02-15 DIAGNOSIS — I10 Essential (primary) hypertension: Secondary | ICD-10-CM | POA: Insufficient documentation

## 2020-02-15 DIAGNOSIS — H9201 Otalgia, right ear: Secondary | ICD-10-CM | POA: Diagnosis not present

## 2020-02-15 DIAGNOSIS — Z79899 Other long term (current) drug therapy: Secondary | ICD-10-CM | POA: Insufficient documentation

## 2020-02-15 DIAGNOSIS — R600 Localized edema: Secondary | ICD-10-CM | POA: Diagnosis present

## 2020-02-15 DIAGNOSIS — R509 Fever, unspecified: Secondary | ICD-10-CM | POA: Insufficient documentation

## 2020-02-15 DIAGNOSIS — L03211 Cellulitis of face: Secondary | ICD-10-CM | POA: Diagnosis not present

## 2020-02-15 DIAGNOSIS — H5711 Ocular pain, right eye: Secondary | ICD-10-CM | POA: Diagnosis not present

## 2020-02-15 DIAGNOSIS — E119 Type 2 diabetes mellitus without complications: Secondary | ICD-10-CM | POA: Diagnosis not present

## 2020-02-15 DIAGNOSIS — Z794 Long term (current) use of insulin: Secondary | ICD-10-CM | POA: Diagnosis not present

## 2020-02-15 LAB — COMPREHENSIVE METABOLIC PANEL
ALT: 28 U/L (ref 0–44)
AST: 21 U/L (ref 15–41)
Albumin: 3.5 g/dL (ref 3.5–5.0)
Alkaline Phosphatase: 74 U/L (ref 38–126)
Anion gap: 11 (ref 5–15)
BUN: 11 mg/dL (ref 6–20)
CO2: 27 mmol/L (ref 22–32)
Calcium: 8.8 mg/dL — ABNORMAL LOW (ref 8.9–10.3)
Chloride: 95 mmol/L — ABNORMAL LOW (ref 98–111)
Creatinine, Ser: 0.89 mg/dL (ref 0.61–1.24)
GFR, Estimated: >60 mL/min (ref >60)
Glucose, Bld: 291 mg/dL — ABNORMAL HIGH (ref 70–99)
Potassium: 3.9 mmol/L (ref 3.5–5.1)
Sodium: 133 mmol/L — ABNORMAL LOW (ref 135–145)
Total Bilirubin: 1 mg/dL (ref 0.3–1.2)
Total Protein: 8.4 g/dL — ABNORMAL HIGH (ref 6.5–8.1)

## 2020-02-15 LAB — CBC WITH DIFFERENTIAL/PLATELET
Abs Immature Granulocytes: 0.05 10*3/uL (ref 0.00–0.07)
Basophils Absolute: 0 10*3/uL (ref 0.0–0.1)
Basophils Relative: 0 %
Eosinophils Absolute: 0.1 10*3/uL (ref 0.0–0.5)
Eosinophils Relative: 1 %
HCT: 44.8 % (ref 39.0–52.0)
Hemoglobin: 15.6 g/dL (ref 13.0–17.0)
Immature Granulocytes: 1 %
Lymphocytes Relative: 22 %
Lymphs Abs: 2.4 10*3/uL (ref 0.7–4.0)
MCH: 27.8 pg (ref 26.0–34.0)
MCHC: 34.8 g/dL (ref 30.0–36.0)
MCV: 79.7 fL — ABNORMAL LOW (ref 80.0–100.0)
Monocytes Absolute: 1.6 10*3/uL — ABNORMAL HIGH (ref 0.1–1.0)
Monocytes Relative: 14 %
Neutro Abs: 6.9 10*3/uL (ref 1.7–7.7)
Neutrophils Relative %: 62 %
Platelets: 275 10*3/uL (ref 150–400)
RBC: 5.62 MIL/uL (ref 4.22–5.81)
RDW: 12.4 % (ref 11.5–15.5)
WBC: 11 10*3/uL — ABNORMAL HIGH (ref 4.0–10.5)
nRBC: 0 % (ref 0.0–0.2)

## 2020-02-15 LAB — CBG MONITORING, ED: Glucose-Capillary: 288 mg/dL — ABNORMAL HIGH (ref 70–99)

## 2020-02-15 MED ORDER — CLINDAMYCIN PHOSPHATE 300 MG/50ML IV SOLN
300.0000 mg | Freq: Once | INTRAVENOUS | Status: AC
  Administered 2020-02-15: 300 mg via INTRAVENOUS
  Filled 2020-02-15: qty 50

## 2020-02-15 MED ORDER — CLINDAMYCIN HCL 150 MG PO CAPS: 300.0000 mg | ORAL_CAPSULE | Freq: Three times a day (TID) | ORAL | 0 refills | Status: AC

## 2020-02-15 MED ORDER — SODIUM CHLORIDE 0.9 % IV BOLUS
1000.0000 mL | Freq: Once | INTRAVENOUS | Status: AC
  Administered 2020-02-15: 1000 mL via INTRAVENOUS

## 2020-02-15 MED ORDER — ACETAMINOPHEN 500 MG PO TABS
1000.0000 mg | ORAL_TABLET | Freq: Once | ORAL | Status: AC
  Administered 2020-02-15: 1000 mg via ORAL
  Filled 2020-02-15: qty 2

## 2020-02-15 MED ORDER — IOHEXOL 300 MG/ML  SOLN
100.0000 mL | Freq: Once | INTRAMUSCULAR | Status: AC | PRN
  Administered 2020-02-15: 75 mL via INTRAVENOUS

## 2020-02-15 NOTE — ED Triage Notes (Addendum)
He woke with fever and right ear pain. His right eye has become swollen throughout the day. He has a rash on his forehead. He was seen at [facility] with a fever 102.6. He was given Ibuprofen and told to come to the ED to r.o mastoiditis. Hx of diabetes but stopped taking his medication "a while ago".

## 2020-02-15 NOTE — ED Provider Notes (Signed)
[facility] EMERGENCY DEPARTMENT Provider Note   CSN: [id number] Arrival date & time: 02/15/20  1725     History Chief Complaint  Patient presents with  . Fever  . Otalgia    William Sanders is a 34 y.o. male presenting for evaluation of fever, ear pain, and facial swelling.  Patient states that when he woke up this morning he had a fever of 102.  He had pain in his right ear, and states over the course of today he has had swelling and redness extending around his right eye.  He denies pain in his eye itself, no pain with movement of the eyeball, but states he has some pressure due to the swelling.  He denies pain or symptoms on the left side.  He has not taken anything for his symptoms.  He reports a history of diabetes for which he does not take any medications.  He was seen at urgent care, sent to the ED for further evaluation.  He denies nasal congestion, sore throat, swelling of the lips/tongue/mouth, difficulty swallowing, cough, chest pain, shortness breath, nausea, vomiting abdominal pain.  HPI     Past Medical History:  Diagnosis Date  . Diabetes mellitus without complication (HCC)   . Hypertension     Patient Active Problem List   Diagnosis Date Noted  . Sepsis (HCC) 10/27/2013  . Type II or unspecified type diabetes mellitus with unspecified complication, uncontrolled 10/27/2013  . Abdominal wall cellulitis 10/26/2013    Past Surgical History:  Procedure Laterality Date  . DEBRIDEMENT OF ABDOMINAL WALL ABSCESS N/A 10/27/2013   Procedure: I&D ABDOMINAL WALL ABSCESS;  Surgeon: Shelly Rubenstein, MD;  Location: [facility] OR;  Service: General;  Laterality: N/A;       Family History  Problem Relation Age of Onset  . HIV Mother   . CAD Father     Social History   Tobacco Use  . Smoking status: Never Smoker  . Smokeless tobacco: Never Used  Substance Use Topics  . Alcohol use: No  . Drug use: No    Home Medications Prior to Admission medications     Medication Sig Start Date End Date Taking? Authorizing Provider  clindamycin (CLEOCIN) 150 MG capsule Take 2 capsules (300 mg total) by mouth 3 (three) times daily for 7 days. 02/15/20 02/22/20  Anecia Nusbaum, PA-C  glucose blood test strip Use as instructed 10/29/13   Zannie Cove, MD  hydrochlorothiazide (HYDRODIURIL) 25 MG tablet Take 25 mg by mouth daily.    [provider]  insulin aspart protamine- aspart (NOVOLOG MIX 70/30) (70-30) 100 UNIT/ML injection Inject 0.2 mLs (20 Units total) into the skin 2 (two) times daily with a meal. 10/29/13   Zannie Cove, MD  Insulin Pen Needle (RELION PEN NEEDLES) 32G X 4 MM MISC USe BID for Insulin administration 10/29/13   Zannie Cove, MD  metFORMIN (GLUCOPHAGE) 850 MG tablet Take 850 mg by mouth 2 (two) times daily with a meal.    [provider]  sulfamethoxazole-trimethoprim (BACTRIM DS,SEPTRA DS) 800-160 MG per tablet Take 1 tablet by mouth 2 (two) times daily. 09/23/14   Mirian Mo, MD  UNABLE TO FIND This note is to excuse Mr.Mahnken from work from 7/14 till 7/21 due to medical illness requiring hospitalizations 10/29/13   Zannie Cove, MD    Allergies    Amoxicillin  Review of Systems   Review of Systems  Constitutional: Positive for fever.  HENT: Positive for ear pain and facial swelling.  Physical Exam Updated Vital Signs BP (!) 134/91   Pulse 90   Temp 100 F (37.8 C) (Oral)   Resp 16   Ht 5\' 9"  (1.753 m)   Wt 110.6 kg   SpO2 98%   BMI 36.01 kg/m   Physical Exam Vitals and nursing note reviewed.  Constitutional:      General: He is not in acute distress.    Appearance: He is well-developed.     Comments: Appears nontoxic  HENT:     Head: Normocephalic and atraumatic.      Comments: Erythema, warmth, and swelling of the skin of the right temple and periorbital tissues.  No vesicles.    Ears:     Comments: Right TM mildly erythematous, no signs of otitis externa.  Left TM normal.     Mouth/Throat:     Comments: OP clear without tonsillar swelling or exudate.  No trismus.  No muffled voice.  Uvula midline.  Handling secretions easily. Eyes:     Extraocular Movements: Extraocular movements intact.     Conjunctiva/sclera: Conjunctivae normal.     Pupils: Pupils are equal, round, and reactive to light.     Comments: Periorbital swelling.  EOMI and PERRLA.  Neck:     Comments: Right-sided cervical lymphadenopathy Cardiovascular:     Rate and Rhythm: Regular rhythm. Tachycardia present.     Pulses: Normal pulses.     Comments: Tachycardic around 110 Pulmonary:     Effort: Pulmonary effort is normal. No respiratory distress.     Breath sounds: Normal breath sounds. No wheezing.  Abdominal:     General: There is no distension.     Palpations: Abdomen is soft. There is no mass.     Tenderness: There is no abdominal tenderness. There is no guarding or rebound.  Musculoskeletal:        General: Normal range of motion.     Cervical back: Normal range of motion and neck supple.  Lymphadenopathy:     Cervical: Cervical adenopathy present.  Skin:    General: Skin is warm and dry.     Capillary Refill: Capillary refill takes less than 2 seconds.  Neurological:     Mental Status: He is alert and oriented to person, place, and time.     ED Results / Procedures / Treatments   Labs (all labs ordered are listed, but only abnormal results are displayed) Labs Reviewed  CBC WITH DIFFERENTIAL/PLATELET - Abnormal; Notable for the following components:      Result Value   WBC 11.0 (*)    MCV 79.7 (*)    Monocytes Absolute 1.6 (*)    All other components within normal limits  COMPREHENSIVE METABOLIC PANEL - Abnormal; Notable for the following components:   Sodium 133 (*)    Chloride 95 (*)    Glucose, Bld 291 (*)    Calcium 8.8 (*)    Total Protein 8.4 (*)    All other components within normal limits  CBG MONITORING, ED - Abnormal; Notable for the following components:    Glucose-Capillary 288 (*)    All other components within normal limits    EKG None  Radiology CT Orbits W Contrast  Result Date: 02/15/2020 CLINICAL DATA:  Orbital cellulitis. Right eye swelling, right ear pain, and fever. EXAM: CT ORBITS WITH CONTRAST TECHNIQUE: Multidetector CT images was performed according to the standard protocol following intravenous contrast administration. CONTRAST:  54mL OMNIPAQUE IOHEXOL 300 MG/ML  SOLN COMPARISON:  None. FINDINGS: Orbits: Moderate right  periorbital soft tissue swelling. No postseptal inflammation, mass, or fluid collection. Grossly intact globes. Visualized sinuses: Minimal bilateral sphenoid and left ethmoid sinus mucosal thickening. Clear mastoid air cells. Soft tissues: Moderate right-sided facial and frontotemporal scalp soft tissue swelling. Limited intracranial: Unremarkable. IMPRESSION: Right periorbital, facial, and scalp soft tissue swelling compatible with cellulitis. No evidence of postseptal cellulitis or abscess. Electronically Signed   By: Sebastian Ache M.D.   On: 02/15/2020 21:06    Procedures Procedures (including critical care time)  Medications Ordered in ED Medications  acetaminophen (TYLENOL) tablet 1,000 mg (1,000 mg Oral Given 02/15/20 1742)  clindamycin (CLEOCIN) IVPB 300 mg (0 mg Intravenous Stopped 02/15/20 2058)  sodium chloride 0.9 % bolus 1,000 mL (0 mLs Intravenous Stopped 02/15/20 2142)  iohexol (OMNIPAQUE) 300 MG/ML solution 100 mL (75 mLs Intravenous Contrast Given 02/15/20 2054)    ED Course  I have reviewed the triage vital signs and the nursing notes.  Pertinent labs & imaging results that were available during my care of the patient were reviewed by me and considered in my medical decision making (see chart for details).    MDM Rules/Calculators/A&P                          Patient presenting for evaluation of right-sided facial pain, swelling, redness.  Additionally, patient with fevers.  On exam, patient  appears nontoxic.  He has what appears to be cellulitis of the face, however concerning due to his rapid spread.  May have early ear infection.  Will obtain labs, CT orbits due to cellulitis around the eye, and start antibiotics.  Case discussed with attending, Dr. Madilyn Hook evaluated the patient.   Labs show mild leukocytosis of 11.  Otherwise reassuring.  Patient is hyperglycemic at 290, but no signs of DKA.  Encourage patient to follow-up with PCP for diabetes management, plan to do so.  CT orbits consistent with cellulitis.  No underlying abscess.  On reassessment, patient reports no further spread of swelling or cellulitis.  He is feeling well at this time.  I discussed importance of antibiotics and strict return precautions.  At this time, patient appears safe for discharge.  Return precautions given.  Patient states eh understands and agrees to plan.  Final Clinical Impression(s) / ED Diagnoses Final diagnoses:  Cellulitis of face    Rx / DC Orders ED Discharge Orders         Ordered    clindamycin (CLEOCIN) 150 MG capsule  3 times daily        02/15/20 2132           Alveria Apley, PA-C 02/15/20 2342    Tilden Fossa, MD 02/17/20 0004

## 2020-02-15 NOTE — Discharge Instructions (Signed)
Take antibiotics as prescribed.  Take the entire course, even if your symptoms improve. Use Tylenol ibuprofen as needed for pain. It is important you call your primary care doctor tomorrow to set up an appointment for further management of your diabetes. Return to the ER if you do not have improvement after 24-48 hours.  Return to the emergency room if you develop worsening redness/swelling/pain, inability to move your eye, vision loss/changes, swelling of your mouth/tongue/throat, or any new, worsening, or concerning symptoms.

## 2022-04-25 IMAGING — CT CT ORBITS W/ CM
3 series · 14 of 47 positions shown, 16 images · IV contrast (omnipaque)
Comparison: None.

CLINICAL DATA: Orbital cellulitis. Right eye swelling, right ear
pain, and fever.

EXAM:
CT ORBITS WITH CONTRAST
TECHNIQUE: Multidetector CT images was performed according to the standard
protocol following intravenous contrast administration.
CONTRAST:  75mL OMNIPAQUE IOHEXOL 300 MG/ML  SOLN

[Series 3: orbits 2.0 h30s st · axial · 0.38mm/px · z∈[+1312,+1390]mm · 8 of 47 slices shown, 10 images]
[im 4/47  brain]
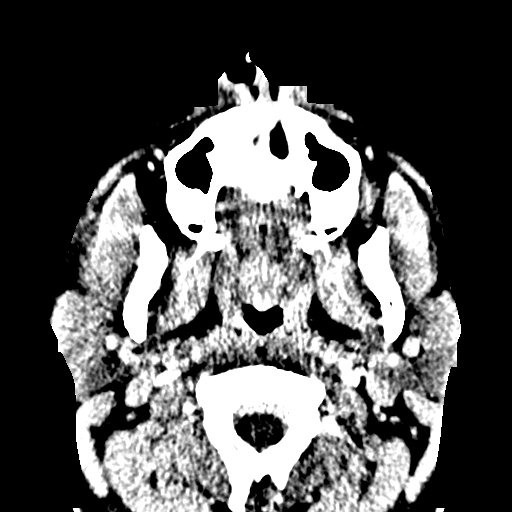
[im 4/47  bone]
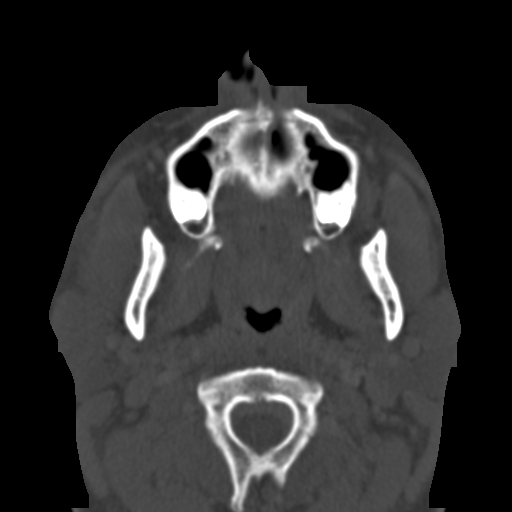
[im 10/47  bone]
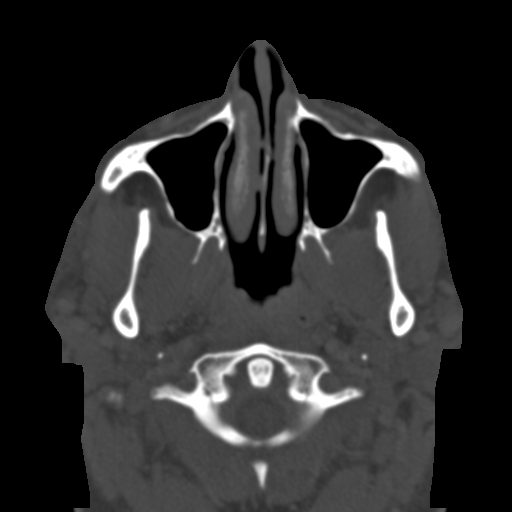
[im 15/47  bone]
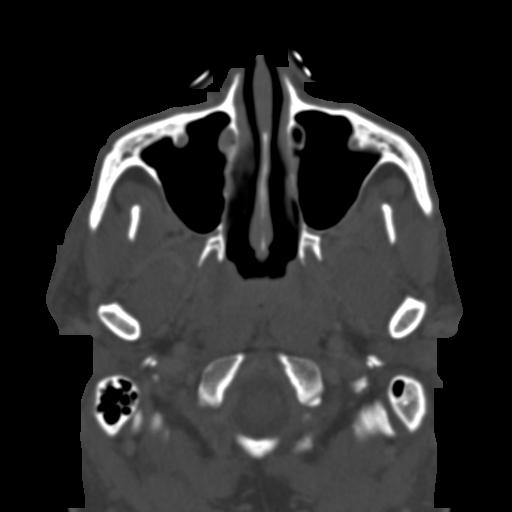
[im 21/47  bone]
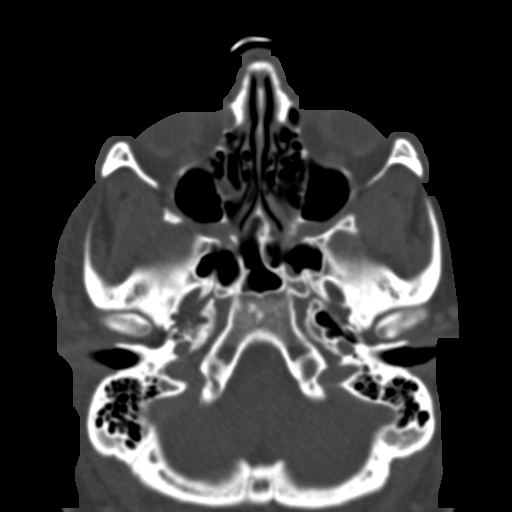
[im 26/47  brain]
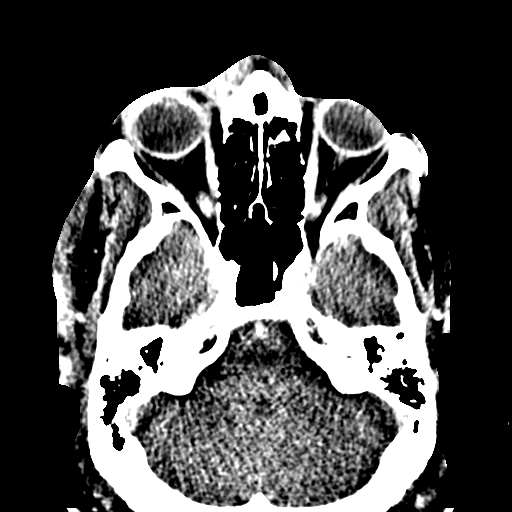
[im 26/47  bone]
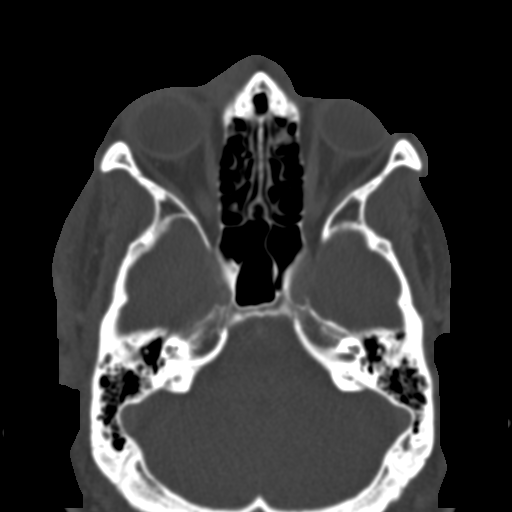
[im 32/47  bone]
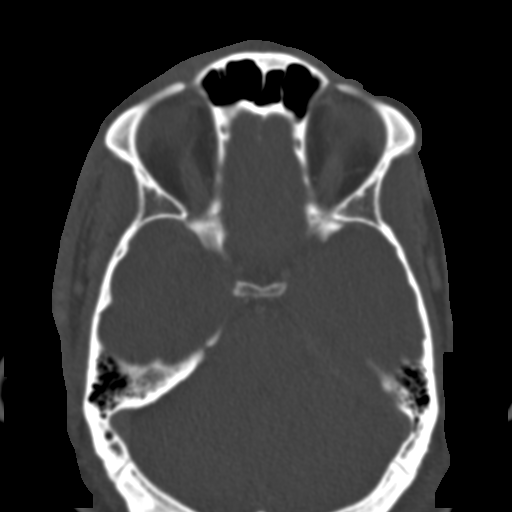
[im 37/47  bone]
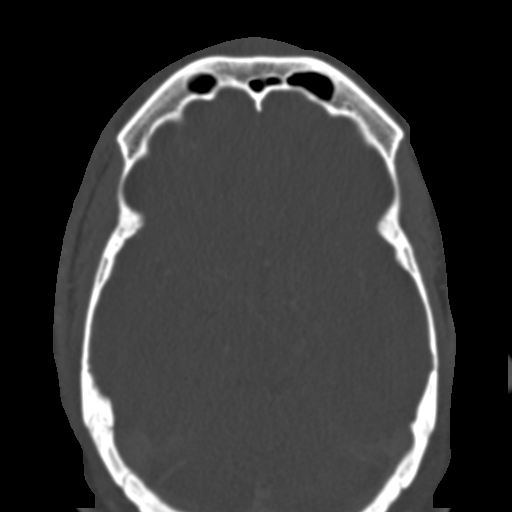
[im 43/47  bone]
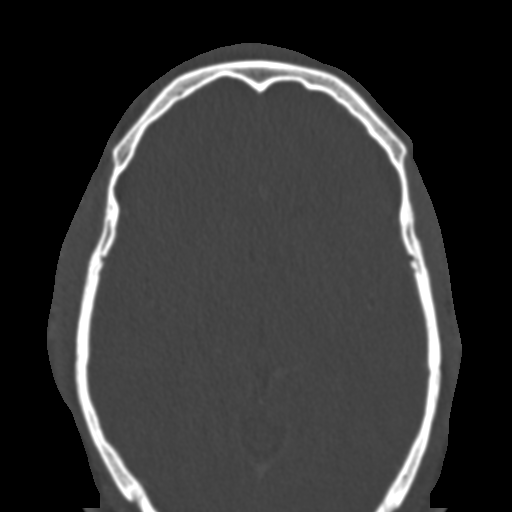

[Series 8: orbits 2.0 coronal · coronal · 0.21mm/px · 3 of 82 slices shown]
[im 28/82  bone]
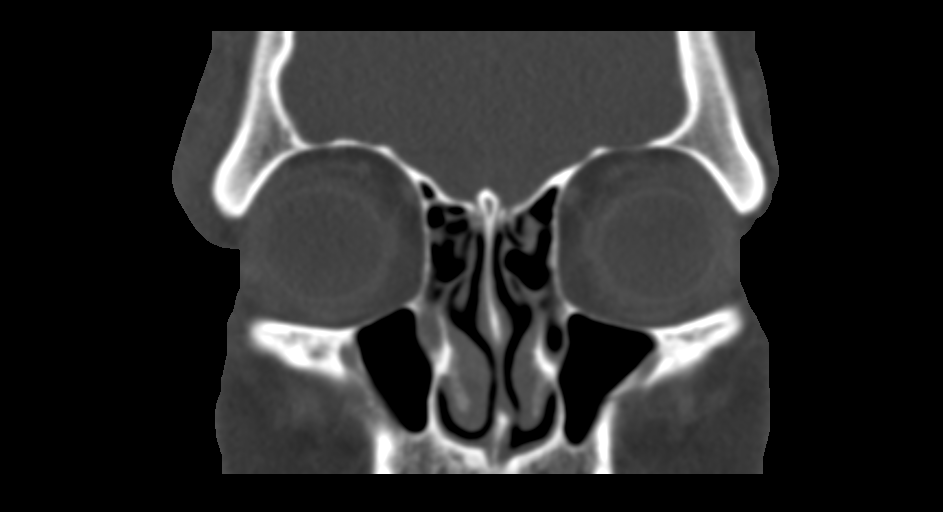
[im 37/82  bone]
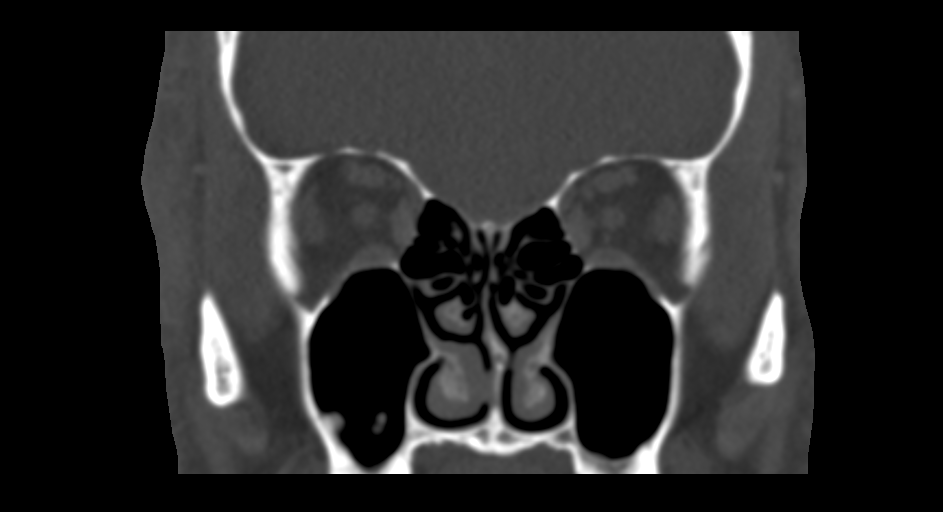
[im 46/82  bone]
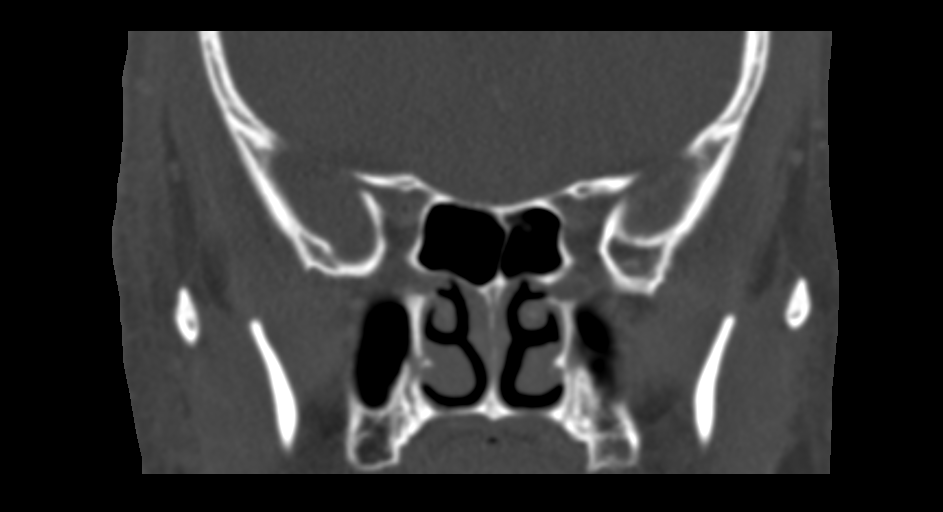

[Series 9: orbits 2.0 sagittal · sagittal · 0.21mm/px · 3 of 96 slices shown]
[im 32/96  bone]
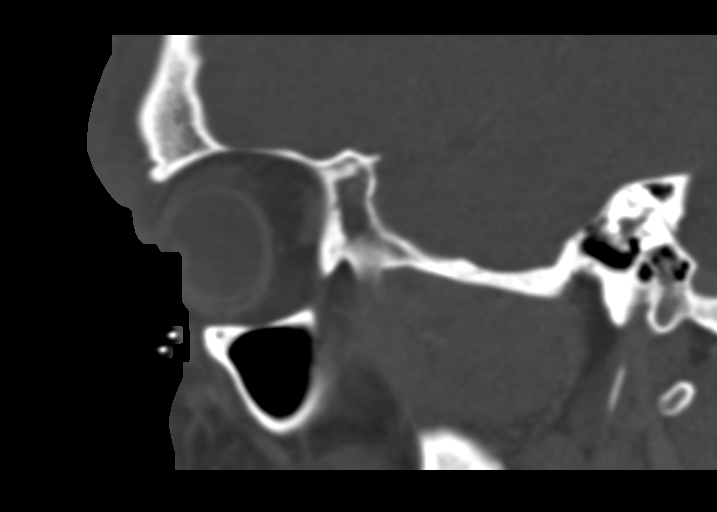
[im 48/96  bone]
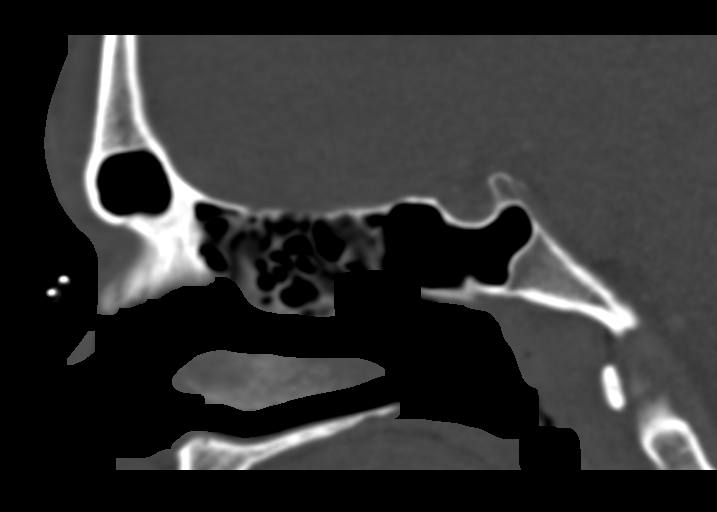
[im 64/96  bone]
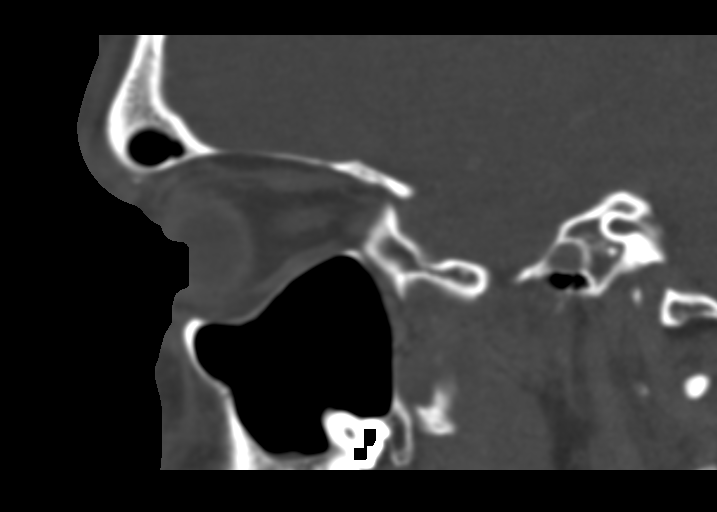

[14 of 47 positions shown; findings below may reference images not displayed]

FINDINGS: Orbits: Moderate right periorbital soft tissue swelling. No
postseptal inflammation, mass, or fluid collection. Grossly intact
globes.

Visualized sinuses: Minimal bilateral sphenoid and left ethmoid
sinus mucosal thickening. Clear mastoid air cells.

Soft tissues: Moderate right-sided facial and frontotemporal scalp
soft tissue swelling.

Limited intracranial: Unremarkable.
IMPRESSION: Right periorbital, facial, and scalp soft tissue swelling compatible
with cellulitis. No evidence of postseptal cellulitis or abscess.

## 2024-01-30 ENCOUNTER — Emergency Department (HOSPITAL_COMMUNITY)
Admission: EM | Admit: 2024-01-30 | Discharge: 2024-01-31 | Disposition: A | Discharge status: Home / Self Care | Attending: Emergency Medicine | Admitting: Emergency Medicine

## 2024-01-30 ENCOUNTER — Encounter (HOSPITAL_COMMUNITY): Payer: Self-pay

## 2024-01-30 ENCOUNTER — Other Ambulatory Visit: Payer: Self-pay

## 2024-01-30 DIAGNOSIS — N179 Acute kidney failure, unspecified: Secondary | ICD-10-CM | POA: Insufficient documentation

## 2024-01-30 DIAGNOSIS — E119 Type 2 diabetes mellitus without complications: Secondary | ICD-10-CM | POA: Diagnosis not present

## 2024-01-30 DIAGNOSIS — I10 Essential (primary) hypertension: Secondary | ICD-10-CM | POA: Insufficient documentation

## 2024-01-30 DIAGNOSIS — L0291 Cutaneous abscess, unspecified: Secondary | ICD-10-CM

## 2024-01-30 DIAGNOSIS — L0201 Cutaneous abscess of face: Secondary | ICD-10-CM | POA: Diagnosis present

## 2024-01-30 LAB — CBC
HCT: 36.4 % — ABNORMAL LOW (ref 39.0–52.0)
Hemoglobin: 12.8 g/dL — ABNORMAL LOW (ref 13.0–17.0)
MCH: 28.2 pg (ref 26.0–34.0)
MCHC: 35.2 g/dL (ref 30.0–36.0)
MCV: 80.2 fL (ref 80.0–100.0)
Platelets: 256 K/uL (ref 150–400)
RBC: 4.54 MIL/uL (ref 4.22–5.81)
RDW: 13.2 % (ref 11.5–15.5)
WBC: 6.8 K/uL (ref 4.0–10.5)
nRBC: 0 % (ref 0.0–0.2)

## 2024-01-30 LAB — COMPREHENSIVE METABOLIC PANEL WITH GFR
ALT: 23 U/L (ref 0–44)
AST: 19 U/L (ref 15–41)
Albumin: 3.5 g/dL (ref 3.5–5.0)
Alkaline Phosphatase: 94 U/L (ref 38–126)
Anion gap: 15 (ref 5–15)
BUN: 34 mg/dL — ABNORMAL HIGH (ref 6–20)
CO2: 24 mmol/L (ref 22–32)
Calcium: 8.7 mg/dL — ABNORMAL LOW (ref 8.9–10.3)
Chloride: 91 mmol/L — ABNORMAL LOW (ref 98–111)
Creatinine, Ser: 2.48 mg/dL — ABNORMAL HIGH (ref 0.61–1.24)
GFR, Estimated: 33 mL/min — ABNORMAL LOW (ref >60)
Glucose, Bld: 392 mg/dL — ABNORMAL HIGH (ref 70–99)
Potassium: 4.3 mmol/L (ref 3.5–5.1)
Sodium: 130 mmol/L — ABNORMAL LOW (ref 135–145)
Total Bilirubin: 0.6 mg/dL (ref 0.0–1.2)
Total Protein: 7.5 g/dL (ref 6.5–8.1)

## 2024-01-30 LAB — CBG MONITORING, ED: Glucose-Capillary: 394 mg/dL — ABNORMAL HIGH (ref 70–99)

## 2024-01-30 MED ORDER — LIDOCAINE-EPINEPHRINE-TETRACAINE (LET) TOPICAL GEL
3.0000 mL | Freq: Once | TOPICAL | Status: AC
  Administered 2024-01-30: 3 mL via TOPICAL
  Filled 2024-01-30: qty 3

## 2024-01-30 MED ORDER — SODIUM CHLORIDE 0.9 % IV BOLUS
1000.0000 mL | Freq: Once | INTRAVENOUS | Status: AC
  Administered 2024-01-30: 1000 mL via INTRAVENOUS

## 2024-01-30 NOTE — ED Provider Notes (Signed)
 [facility]PT [facility] Emergency Department Provider Note MRN:  [id number]  Arrival date & time: 01/31/24     Chief Complaint   Abscess (/)   History of Present Illness   William Sanders is a 38 y.o. year-old male with a history of hypertension, diabetes presenting to the ED with chief complaint of abscess.  Skin abscess to the right side of his face for the past week or so, not going away.  Over the past few days has been feeling very lightheaded upon standing.  Works for [employer] and has to get in and out of cars all day and that has been difficult.  When he stands up sometimes his vision starts going out as well.  Denies chest pain or shortness of breath, no pain other than the abscess.  Review of Systems  A thorough review of systems was obtained and all systems are negative except as noted in the HPI and PMH.   Patient's Health History    Past Medical History:  Diagnosis Date   Diabetes mellitus without complication (HCC)    Hypertension     Past Surgical History:  Procedure Laterality Date   DEBRIDEMENT OF ABDOMINAL WALL ABSCESS N/A 10/27/2013   Procedure: I&D ABDOMINAL WALL ABSCESS;  Surgeon: Vicenta DELENA Poli, MD;  Location: [facility] OR;  Service: General;  Laterality: N/A;    Family History  Problem Relation Age of Onset   HIV Mother    CAD Father     Social History   Socioeconomic History   Marital status: Single    Spouse name: Not on file   Number of children: Not on file   Years of education: Not on file   Highest education level: Not on file  Occupational History   Not on file  Tobacco Use   Smoking status: Never   Smokeless tobacco: Never  Substance and Sexual Activity   Alcohol use: No   Drug use: No   Sexual activity: Not on file  Other Topics Concern   Not on file  Social History Narrative   Not on file   Social Drivers of Health   Financial Resource Strain: Not on file  Food Insecurity: Not on file  Transportation Needs: Not on file   Physical Activity: Not on file  Stress: Not on file  Social Connections: Not on file  Intimate Partner Violence: Not on file     Physical Exam   Vitals:   01/31/24 0149 01/31/24 0200  BP: (!) 146/98 (!) 132/94  Pulse: 86 85  Resp: 18   Temp: 98.8 F (37.1 C)   SpO2: 100% 100%    CONSTITUTIONAL: Well-appearing, NAD NEURO/PSYCH:  Alert and oriented x 3, no focal deficits EYES:  eyes equal and reactive ENT/NECK:  no LAD, no JVD CARDIO: Regular rate, well-perfused, normal S1 and S2 PULM:  CTAB no wheezing or rhonchi GI/GU:  non-distended, non-tender MSK/SPINE:  No gross deformities, no edema SKIN: Fluctuant abscess to the right face   *Additional and/or pertinent findings included in MDM below  Diagnostic and Interventional Summary    EKG Interpretation Date/Time:  Thursday January 30 2024 18:54:02 EDT Ventricular Rate:  98 PR Interval:  162 QRS Duration:  88 QT Interval:  354 QTC Calculation: 451 R Axis:   68  Text Interpretation: Normal sinus rhythm ST & T wave abnormality, consider inferolateral ischemia Abnormal ECG When compared with ECG of 27-Oct-2013 05:26, PREVIOUS ECG IS PRESENT Confirmed by Theadore Sharper [phone]) on 01/30/2024 11:08:20 PM  Labs Reviewed  COMPREHENSIVE METABOLIC PANEL WITH GFR - Abnormal; Notable for the following components:      Result Value   Sodium 130 (*)    Chloride 91 (*)    Glucose, Bld 392 (*)    BUN 34 (*)    Creatinine, Ser 2.48 (*)    Calcium 8.7 (*)    GFR, Estimated 33 (*)    All other components within normal limits  CBC - Abnormal; Notable for the following components:   Hemoglobin 12.8 (*)    HCT 36.4 (*)    All other components within normal limits  BASIC METABOLIC PANEL WITH GFR - Abnormal; Notable for the following components:   Sodium 131 (*)    Potassium 5.3 (*)    Glucose, Bld 317 (*)    BUN 35 (*)    Creatinine, Ser 2.26 (*)    Calcium 7.8 (*)    GFR, Estimated 37 (*)    All other components  within normal limits  CBG MONITORING, ED - Abnormal; Notable for the following components:   Glucose-Capillary 394 (*)    All other components within normal limits    No orders to display    Medications  sodium chloride  0.9 % bolus 1,000 mL (0 mLs Intravenous Stopped 01/31/24 0110)  lidocaine -EPINEPHrine -tetracaine (LET) topical gel (3 mLs Topical Given 01/30/24 2322)  sodium chloride  0.9 % bolus 1,000 mL (0 mLs Intravenous Stopped 01/31/24 0111)     Procedures  /  Critical Care Procedures  ED Course and Medical Decision Making  Initial Impression and Ddx Suspect dehydration and orthostatic hypotension.  Informed by nursing that he is orthostatic upon standing.  Providing fluids.  Abscess would likely benefit from at least aspiration or drainage.  No fever.  Past medical/surgical history that increases complexity of ED encounter: Diabetes  Interpretation of Diagnostics I personally reviewed the Laboratory Testing and my interpretation is as follows: Labs reveal acute kidney injury, hyperglycemia    Patient Reassessment and Ultimate Disposition/Management     I&D manage as described above.  After 2 L of fluid patient is feeling better.  Repeat BMP showing slight improvement in renal function.  Again I discussed the option of admission, overall I felt it was my recommendation that he be admitted to the hospital but he has a lot of concerns about hospitalization cost and he is unwilling to stay.  He agrees to see his outpatient doctor very soon, return precautions discussed.  Appropriate for discharge.  Patient management required discussion with the following services or consulting groups:  None  Complexity of Problems Addressed Acute illness or injury that poses threat of life of bodily function  Additional Data Reviewed and Analyzed Further history obtained from: Prior labs/imaging results  Additional Factors Impacting ED Encounter Risk Consideration of  hospitalization  Ozell HERO. Theadore, MD [facility] Emergency Medicine [facility] [email or web address]  Final Clinical Impressions(s) / ED Diagnoses     ICD-10-CM   1. Abscess  L02.91     2. Acute kidney injury  N17.9       ED Discharge Orders          Ordered    cephALEXin (KEFLEX) 500 MG capsule  4 times daily        01/31/24 0219             Discharge Instructions Discussed with and Provided to Patient:     Discharge Instructions      You were evaluated in the Emergency Department  and after careful evaluation, we did not find any emergent condition requiring admission or further testing in the hospital.  Your exam/testing today is overall reassuring.  We drained your abscess here in the emergency department.  This area may continue to drain for the next few days.  This is normal.  Take the Keflex antibiotic to help clear any remaining infection.  We discussed your dehydration and acute kidney injury and high blood sugars.  We offered admission to the hospital but you prefer discharge, further hydration at home, close follow-up with your primary care doctor.  Please see your doctor within the next few days and have your blood levels rechecked, also you should discuss your blood sugar management with your primary care doctor.  Please return to the Emergency Department if you experience any worsening of your condition.   Thank you for allowing us  to be a part of your care.       Theadore Ozell HERO, MD 01/31/24 [phone]

## 2024-01-30 NOTE — ED Notes (Signed)
 Patient placed in ER 29, assumed care of patient at this time.

## 2024-01-30 NOTE — ED Triage Notes (Signed)
 Pt was at work and felt lethargic/ dizziness. Denies numbness/tingling/blurred vision. C/O ear ringing. Axox4.

## 2024-01-30 NOTE — ED Notes (Signed)
 Patient reports getting really dizzy with position changes.

## 2024-01-31 LAB — BASIC METABOLIC PANEL WITH GFR
Anion gap: 11 (ref 5–15)
BUN: 35 mg/dL — ABNORMAL HIGH (ref 6–20)
CO2: 22 mmol/L (ref 22–32)
Calcium: 7.8 mg/dL — ABNORMAL LOW (ref 8.9–10.3)
Chloride: 98 mmol/L (ref 98–111)
Creatinine, Ser: 2.26 mg/dL — ABNORMAL HIGH (ref 0.61–1.24)
GFR, Estimated: 37 mL/min — ABNORMAL LOW (ref >60)
Glucose, Bld: 317 mg/dL — ABNORMAL HIGH (ref 70–99)
Potassium: 5.3 mmol/L — ABNORMAL HIGH (ref 3.5–5.1)
Sodium: 131 mmol/L — ABNORMAL LOW (ref 135–145)

## 2024-01-31 MED ORDER — CEPHALEXIN 500 MG PO CAPS: 500.0000 mg | ORAL_CAPSULE | Freq: Four times a day (QID) | ORAL | 0 refills | Status: AC

## 2024-01-31 NOTE — Discharge Instructions (Signed)
 You were evaluated in the Emergency Department and after careful evaluation, we did not find any emergent condition requiring admission or further testing in the hospital.  Your exam/testing today is overall reassuring.  We drained your abscess here in the emergency department.  This area may continue to drain for the next few days.  This is normal.  Take the Keflex antibiotic to help clear any remaining infection.  We discussed your dehydration and acute kidney injury and high blood sugars.  We offered admission to the hospital but you prefer discharge, further hydration at home, close follow-up with your primary care doctor.  Please see your doctor within the next few days and have your blood levels rechecked, also you should discuss your blood sugar management with your primary care doctor.  Please return to the Emergency Department if you experience any worsening of your condition.   Thank you for allowing us  to be a part of your care.
